# Patient Record
Sex: Female | Born: 1945 | Race: White | Hispanic: No | Marital: Married | Smoking: Never smoker
Health system: Southern US, Community
[De-identification: ages and names within clinical notes are randomized; demographics above are authoritative.]

## PROBLEM LIST (undated history)

## (undated) DIAGNOSIS — M949 Disorder of cartilage, unspecified: Secondary | ICD-10-CM

## (undated) DIAGNOSIS — N938 Other specified abnormal uterine and vaginal bleeding: Secondary | ICD-10-CM

## (undated) DIAGNOSIS — N809 Endometriosis, unspecified: Secondary | ICD-10-CM

## (undated) DIAGNOSIS — K589 Irritable bowel syndrome without diarrhea: Secondary | ICD-10-CM

## (undated) DIAGNOSIS — R252 Cramp and spasm: Secondary | ICD-10-CM

## (undated) DIAGNOSIS — E039 Hypothyroidism, unspecified: Secondary | ICD-10-CM

## (undated) DIAGNOSIS — Z860100 Personal history of colon polyps, unspecified: Secondary | ICD-10-CM

## (undated) DIAGNOSIS — D0510 Intraductal carcinoma in situ of unspecified breast: Secondary | ICD-10-CM

## (undated) DIAGNOSIS — C189 Malignant neoplasm of colon, unspecified: Secondary | ICD-10-CM

## (undated) DIAGNOSIS — N8003 Adenomyosis of the uterus: Secondary | ICD-10-CM

## (undated) DIAGNOSIS — N879 Dysplasia of cervix uteri, unspecified: Secondary | ICD-10-CM

## (undated) DIAGNOSIS — K7689 Other specified diseases of liver: Secondary | ICD-10-CM

## (undated) DIAGNOSIS — M810 Age-related osteoporosis without current pathological fracture: Secondary | ICD-10-CM

## (undated) DIAGNOSIS — N800 Endometriosis of uterus: Secondary | ICD-10-CM

## (undated) DIAGNOSIS — K635 Polyp of colon: Secondary | ICD-10-CM

## (undated) DIAGNOSIS — K449 Diaphragmatic hernia without obstruction or gangrene: Secondary | ICD-10-CM

## (undated) DIAGNOSIS — M899 Disorder of bone, unspecified: Secondary | ICD-10-CM

## (undated) DIAGNOSIS — Z853 Personal history of malignant neoplasm of breast: Secondary | ICD-10-CM

## (undated) DIAGNOSIS — Z8669 Personal history of other diseases of the nervous system and sense organs: Secondary | ICD-10-CM

## (undated) HISTORY — PX: VAGINAL HYSTERECTOMY: SUR661

## (undated) HISTORY — DX: Disorder of cartilage, unspecified: M94.9

## (undated) HISTORY — DX: Adenomyosis of the uterus: N80.03

## (undated) HISTORY — DX: Other specified abnormal uterine and vaginal bleeding: N93.8

## (undated) HISTORY — DX: Hypothyroidism, unspecified: E03.9

## (undated) HISTORY — DX: Disorder of bone, unspecified: M89.9

## (undated) HISTORY — DX: Other specified diseases of liver: K76.89

## (undated) HISTORY — DX: Personal history of colonic polyps: Z86.010

## (undated) HISTORY — DX: Endometriosis, unspecified: N80.9

## (undated) HISTORY — PX: SEPTOPLASTY: SUR1290

## (undated) HISTORY — DX: Malignant neoplasm of colon, unspecified: C18.9

## (undated) HISTORY — PX: BREAST ENHANCEMENT SURGERY: SHX7

## (undated) HISTORY — DX: Irritable bowel syndrome, unspecified: K58.9

## (undated) HISTORY — PX: GYNECOLOGIC CRYOSURGERY: SHX857

## (undated) HISTORY — DX: Endometriosis of uterus: N80.0

## (undated) HISTORY — DX: Personal history of colon polyps, unspecified: Z86.0100

## (undated) HISTORY — PX: SKIN BIOPSY: SHX1

## (undated) HISTORY — DX: Intraductal carcinoma in situ of unspecified breast: D05.10

## (undated) HISTORY — DX: Personal history of other diseases of the nervous system and sense organs: Z86.69

## (undated) HISTORY — PX: BREAST LUMPECTOMY: SHX2

## (undated) HISTORY — PX: AUGMENTATION MAMMAPLASTY: SUR837

## (undated) HISTORY — DX: Polyp of colon: K63.5

## (undated) HISTORY — DX: Diaphragmatic hernia without obstruction or gangrene: K44.9

## (undated) HISTORY — DX: Cramp and spasm: R25.2

## (undated) HISTORY — PX: COLPOSCOPY: SHX161

## (undated) HISTORY — PX: TUBAL LIGATION: SHX77

## (undated) HISTORY — DX: Dysplasia of cervix uteri, unspecified: N87.9

## (undated) HISTORY — PX: BREAST IMPLANT REMOVAL: SUR1101

## (undated) HISTORY — DX: Personal history of malignant neoplasm of breast: Z85.3

## (undated) HISTORY — DX: Age-related osteoporosis without current pathological fracture: M81.0

---
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
## 1978-09-13 HISTORY — PX: PARTIAL HYSTERECTOMY: SHX80

## 1989-05-14 HISTORY — PX: HEMORROIDECTOMY: SUR656

## 1998-09-23 ENCOUNTER — Other Ambulatory Visit: Admission: RE | Admit: 1998-09-23 | Discharge: 1998-09-23 | Payer: Self-pay | Admitting: Obstetrics and Gynecology

## 1999-10-23 ENCOUNTER — Other Ambulatory Visit: Admission: RE | Admit: 1999-10-23 | Discharge: 1999-10-23 | Payer: Self-pay | Admitting: Obstetrics and Gynecology

## 2000-11-09 ENCOUNTER — Other Ambulatory Visit: Admission: RE | Admit: 2000-11-09 | Discharge: 2000-11-09 | Payer: Self-pay | Admitting: Obstetrics and Gynecology

## 2002-03-01 ENCOUNTER — Other Ambulatory Visit: Admission: RE | Admit: 2002-03-01 | Discharge: 2002-03-01 | Payer: Self-pay | Admitting: Obstetrics and Gynecology

## 2003-01-03 ENCOUNTER — Encounter: Payer: Self-pay | Admitting: Internal Medicine

## 2003-03-26 ENCOUNTER — Other Ambulatory Visit: Admission: RE | Admit: 2003-03-26 | Discharge: 2003-03-26 | Payer: Self-pay | Admitting: Obstetrics and Gynecology

## 2003-07-03 ENCOUNTER — Encounter: Payer: Self-pay | Admitting: Family Medicine

## 2003-07-03 ENCOUNTER — Ambulatory Visit (HOSPITAL_COMMUNITY): Admission: RE | Admit: 2003-07-03 | Discharge: 2003-07-03 | Payer: Self-pay | Admitting: Family Medicine

## 2003-08-28 ENCOUNTER — Ambulatory Visit (HOSPITAL_COMMUNITY): Admission: RE | Admit: 2003-08-28 | Discharge: 2003-08-28 | Payer: Self-pay | Admitting: Neurology

## 2003-09-04 ENCOUNTER — Ambulatory Visit (HOSPITAL_COMMUNITY): Admission: RE | Admit: 2003-09-04 | Discharge: 2003-09-04 | Payer: Self-pay | Admitting: Neurology

## 2003-09-04 ENCOUNTER — Encounter: Payer: Self-pay | Admitting: Cardiovascular Disease

## 2004-05-13 ENCOUNTER — Other Ambulatory Visit: Admission: RE | Admit: 2004-05-13 | Discharge: 2004-05-13 | Payer: Self-pay | Admitting: Obstetrics and Gynecology

## 2005-07-14 ENCOUNTER — Encounter: Payer: Self-pay | Admitting: Family Medicine

## 2005-08-04 ENCOUNTER — Other Ambulatory Visit: Admission: RE | Admit: 2005-08-04 | Discharge: 2005-08-04 | Payer: Self-pay | Admitting: Obstetrics and Gynecology

## 2006-04-05 ENCOUNTER — Ambulatory Visit: Payer: Self-pay | Admitting: Internal Medicine

## 2006-04-06 ENCOUNTER — Ambulatory Visit: Payer: Self-pay | Admitting: Internal Medicine

## 2006-04-19 ENCOUNTER — Ambulatory Visit: Payer: Self-pay | Admitting: Internal Medicine

## 2006-10-24 ENCOUNTER — Ambulatory Visit: Payer: Self-pay | Admitting: Family Medicine

## 2006-10-24 ENCOUNTER — Other Ambulatory Visit: Admission: RE | Admit: 2006-10-24 | Discharge: 2006-10-24 | Payer: Self-pay | Admitting: Obstetrics and Gynecology

## 2006-10-24 LAB — CONVERTED CEMR LAB
ALT: 18 units/L (ref 0–40)
Alkaline Phosphatase: 47 units/L (ref 39–117)
BUN: 8 mg/dL (ref 6–23)
Basophils Relative: 1.4 % — ABNORMAL HIGH (ref 0.0–1.0)
Calcium: 9.2 mg/dL (ref 8.4–10.5)
Cholesterol: 202 mg/dL (ref 0–200)
Eosinophils Absolute: 0.1 10*3/uL (ref 0.0–0.6)
GFR calc Af Amer: 94 mL/min
GFR calc non Af Amer: 78 mL/min
HDL: 78.9 mg/dL (ref >39.0)
Lymphocytes Relative: 24.8 % (ref 12.0–46.0)
MCV: 95.9 fL (ref 78.0–100.0)
Monocytes Relative: 8.6 % (ref 3.0–11.0)
Neutro Abs: 2.4 10*3/uL (ref 1.4–7.7)
Platelets: 282 10*3/uL (ref 150–400)
VLDL: 18 mg/dL (ref 0–40)

## 2007-05-09 ENCOUNTER — Encounter (INDEPENDENT_AMBULATORY_CARE_PROVIDER_SITE_OTHER): Payer: Self-pay | Admitting: *Deleted

## 2007-05-09 ENCOUNTER — Ambulatory Visit (HOSPITAL_COMMUNITY): Admission: RE | Admit: 2007-05-09 | Discharge: 2007-05-09 | Payer: Self-pay | Admitting: Endocrinology

## 2007-11-28 ENCOUNTER — Other Ambulatory Visit: Admission: RE | Admit: 2007-11-28 | Discharge: 2007-11-28 | Payer: Self-pay | Admitting: Obstetrics and Gynecology

## 2008-01-17 ENCOUNTER — Encounter: Payer: Self-pay | Admitting: Family Medicine

## 2008-01-17 DIAGNOSIS — K589 Irritable bowel syndrome without diarrhea: Secondary | ICD-10-CM

## 2008-01-17 DIAGNOSIS — Z87898 Personal history of other specified conditions: Secondary | ICD-10-CM

## 2008-01-17 DIAGNOSIS — E039 Hypothyroidism, unspecified: Secondary | ICD-10-CM

## 2008-01-19 ENCOUNTER — Ambulatory Visit: Payer: Self-pay | Admitting: Family Medicine

## 2008-01-19 DIAGNOSIS — R079 Chest pain, unspecified: Secondary | ICD-10-CM

## 2008-01-19 DIAGNOSIS — R5383 Other fatigue: Secondary | ICD-10-CM

## 2008-01-19 DIAGNOSIS — R5381 Other malaise: Secondary | ICD-10-CM | POA: Insufficient documentation

## 2008-01-19 DIAGNOSIS — R252 Cramp and spasm: Secondary | ICD-10-CM | POA: Insufficient documentation

## 2008-01-23 ENCOUNTER — Ambulatory Visit: Payer: Self-pay | Admitting: Internal Medicine

## 2008-02-01 ENCOUNTER — Encounter: Payer: Self-pay | Admitting: Internal Medicine

## 2008-02-01 ENCOUNTER — Ambulatory Visit: Payer: Self-pay | Admitting: Internal Medicine

## 2008-02-02 ENCOUNTER — Telehealth: Payer: Self-pay | Admitting: Internal Medicine

## 2008-02-21 ENCOUNTER — Encounter: Payer: Self-pay | Admitting: Family Medicine

## 2008-03-03 ENCOUNTER — Encounter: Payer: Self-pay | Admitting: Internal Medicine

## 2008-12-03 ENCOUNTER — Other Ambulatory Visit: Admission: RE | Admit: 2008-12-03 | Discharge: 2008-12-03 | Payer: Self-pay | Admitting: Obstetrics and Gynecology

## 2008-12-03 ENCOUNTER — Encounter: Payer: Self-pay | Admitting: Obstetrics and Gynecology

## 2008-12-03 ENCOUNTER — Ambulatory Visit: Payer: Self-pay | Admitting: Obstetrics and Gynecology

## 2008-12-10 ENCOUNTER — Encounter: Payer: Self-pay | Admitting: Family Medicine

## 2008-12-10 LAB — HM MAMMOGRAPHY: HM Mammogram: ABNORMAL

## 2008-12-12 ENCOUNTER — Encounter (INDEPENDENT_AMBULATORY_CARE_PROVIDER_SITE_OTHER): Payer: Self-pay | Admitting: *Deleted

## 2009-02-11 ENCOUNTER — Encounter: Admission: RE | Admit: 2009-02-11 | Discharge: 2009-02-11 | Payer: Self-pay | Admitting: Radiology

## 2009-03-20 ENCOUNTER — Ambulatory Visit (HOSPITAL_COMMUNITY): Admission: RE | Admit: 2009-03-20 | Discharge: 2009-03-20 | Payer: Self-pay | Admitting: General Surgery

## 2009-03-20 ENCOUNTER — Encounter (INDEPENDENT_AMBULATORY_CARE_PROVIDER_SITE_OTHER): Payer: Self-pay | Admitting: General Surgery

## 2009-04-01 ENCOUNTER — Encounter (INDEPENDENT_AMBULATORY_CARE_PROVIDER_SITE_OTHER): Payer: Self-pay | Admitting: General Surgery

## 2009-04-01 ENCOUNTER — Ambulatory Visit (HOSPITAL_BASED_OUTPATIENT_CLINIC_OR_DEPARTMENT_OTHER): Admission: RE | Admit: 2009-04-01 | Discharge: 2009-04-01 | Payer: Self-pay | Admitting: General Surgery

## 2009-04-08 ENCOUNTER — Ambulatory Visit: Payer: Self-pay | Admitting: Oncology

## 2009-04-23 ENCOUNTER — Encounter: Payer: Self-pay | Admitting: Family Medicine

## 2009-04-23 ENCOUNTER — Ambulatory Visit (HOSPITAL_COMMUNITY): Admission: RE | Admit: 2009-04-23 | Discharge: 2009-04-23 | Payer: Self-pay | Admitting: Oncology

## 2009-04-23 LAB — CBC WITH DIFFERENTIAL/PLATELET
Basophils Absolute: 0 10*3/uL (ref 0.0–0.1)
EOS%: 1.1 % (ref 0.0–7.0)
HCT: 39.1 % (ref 34.8–46.6)
HGB: 13.5 g/dL (ref 11.6–15.9)
MCH: 31.9 pg (ref 25.1–34.0)
MCHC: 34.7 g/dL (ref 31.5–36.0)
MCV: 91.9 fL (ref 79.5–101.0)
MONO%: 9.6 % (ref 0.0–14.0)
NEUT%: 62.8 % (ref 38.4–76.8)
RDW: 12.8 % (ref 11.2–14.5)

## 2009-04-24 LAB — VITAMIN D 25 HYDROXY (VIT D DEFICIENCY, FRACTURES): Vit D, 25-Hydroxy: 47 ng/mL (ref 30–89)

## 2009-04-24 LAB — COMPREHENSIVE METABOLIC PANEL
AST: 20 U/L (ref 0–37)
Alkaline Phosphatase: 58 U/L (ref 39–117)
BUN: 13 mg/dL (ref 6–23)
Creatinine, Ser: 0.71 mg/dL (ref 0.40–1.20)

## 2009-04-25 ENCOUNTER — Ambulatory Visit: Admission: RE | Admit: 2009-04-25 | Discharge: 2009-07-14 | Payer: Self-pay | Admitting: Radiation Oncology

## 2009-04-26 ENCOUNTER — Ambulatory Visit (HOSPITAL_COMMUNITY): Admission: RE | Admit: 2009-04-26 | Discharge: 2009-04-26 | Payer: Self-pay | Admitting: Oncology

## 2009-04-26 ENCOUNTER — Encounter (INDEPENDENT_AMBULATORY_CARE_PROVIDER_SITE_OTHER): Payer: Self-pay | Admitting: *Deleted

## 2009-04-28 ENCOUNTER — Encounter: Payer: Self-pay | Admitting: Family Medicine

## 2009-06-27 ENCOUNTER — Ambulatory Visit: Payer: Self-pay | Admitting: Oncology

## 2009-07-01 ENCOUNTER — Encounter: Payer: Self-pay | Admitting: Family Medicine

## 2009-07-01 LAB — CBC WITH DIFFERENTIAL/PLATELET
Basophils Absolute: 0 10*3/uL (ref 0.0–0.1)
EOS%: 2.7 % (ref 0.0–7.0)
HCT: 39.1 % (ref 34.8–46.6)
HGB: 13.8 g/dL (ref 11.6–15.9)
LYMPH%: 15.1 % (ref 14.0–49.7)
MCH: 32.6 pg (ref 25.1–34.0)
NEUT%: 65.3 % (ref 38.4–76.8)
Platelets: 208 10*3/uL (ref 145–400)
lymph#: 0.5 10*3/uL — ABNORMAL LOW (ref 0.9–3.3)

## 2009-07-08 ENCOUNTER — Encounter: Payer: Self-pay | Admitting: Family Medicine

## 2009-08-12 ENCOUNTER — Encounter: Payer: Self-pay | Admitting: Family Medicine

## 2009-08-21 ENCOUNTER — Ambulatory Visit: Payer: Self-pay | Admitting: Oncology

## 2009-08-25 ENCOUNTER — Encounter: Payer: Self-pay | Admitting: Family Medicine

## 2009-08-26 ENCOUNTER — Ambulatory Visit: Payer: Self-pay | Admitting: Obstetrics and Gynecology

## 2009-08-28 ENCOUNTER — Ambulatory Visit: Payer: Self-pay | Admitting: Obstetrics and Gynecology

## 2009-09-19 ENCOUNTER — Ambulatory Visit: Payer: Self-pay | Admitting: Oncology

## 2009-10-03 ENCOUNTER — Encounter: Payer: Self-pay | Admitting: Family Medicine

## 2009-11-21 ENCOUNTER — Ambulatory Visit: Payer: Self-pay | Admitting: Oncology

## 2009-11-25 LAB — CBC WITH DIFFERENTIAL/PLATELET
Basophils Absolute: 0 10*3/uL (ref 0.0–0.1)
Eosinophils Absolute: 0.2 10*3/uL (ref 0.0–0.5)
HGB: 13.6 g/dL (ref 11.6–15.9)
MONO#: 0.4 10*3/uL (ref 0.1–0.9)
NEUT#: 2.8 10*3/uL (ref 1.5–6.5)
RDW: 12.8 % (ref 11.2–14.5)
lymph#: 0.7 10*3/uL — ABNORMAL LOW (ref 0.9–3.3)

## 2009-11-25 LAB — COMPREHENSIVE METABOLIC PANEL
Albumin: 4.4 g/dL (ref 3.5–5.2)
BUN: 9 mg/dL (ref 6–23)
Calcium: 9.5 mg/dL (ref 8.4–10.5)
Chloride: 100 mEq/L (ref 96–112)
Glucose, Bld: 92 mg/dL (ref 70–99)
Potassium: 4.3 mEq/L (ref 3.5–5.3)

## 2009-11-25 LAB — VITAMIN D 25 HYDROXY (VIT D DEFICIENCY, FRACTURES): Vit D, 25-Hydroxy: 52 ng/mL (ref 30–89)

## 2009-12-01 ENCOUNTER — Encounter: Payer: Self-pay | Admitting: Family Medicine

## 2010-01-26 ENCOUNTER — Ambulatory Visit: Payer: Self-pay | Admitting: Oncology

## 2010-01-26 ENCOUNTER — Encounter: Payer: Self-pay | Admitting: Internal Medicine

## 2010-01-26 LAB — CBC WITH DIFFERENTIAL/PLATELET
BASO%: 0.3 % (ref 0.0–2.0)
HCT: 39 % (ref 34.8–46.6)
LYMPH%: 19.9 % (ref 14.0–49.7)
MCHC: 35.2 g/dL (ref 31.5–36.0)
MCV: 93.3 fL (ref 79.5–101.0)
MONO#: 0.3 10*3/uL (ref 0.1–0.9)
NEUT%: 70.3 % (ref 38.4–76.8)
Platelets: 263 10*3/uL (ref 145–400)
WBC: 4.4 10*3/uL (ref 3.9–10.3)

## 2010-01-26 LAB — COMPREHENSIVE METABOLIC PANEL
ALT: 19 U/L (ref 0–35)
CO2: 29 mEq/L (ref 19–32)
Creatinine, Ser: 0.73 mg/dL (ref 0.40–1.20)
Glucose, Bld: 91 mg/dL (ref 70–99)
Total Bilirubin: 0.8 mg/dL (ref 0.3–1.2)

## 2010-01-27 LAB — CANCER ANTIGEN 27.29: CA 27.29: 25 U/mL (ref 0–39)

## 2010-01-27 LAB — VITAMIN D 25 HYDROXY (VIT D DEFICIENCY, FRACTURES): Vit D, 25-Hydroxy: 58 ng/mL (ref 30–89)

## 2010-02-02 ENCOUNTER — Encounter: Payer: Self-pay | Admitting: Family Medicine

## 2010-02-10 ENCOUNTER — Other Ambulatory Visit: Admission: RE | Admit: 2010-02-10 | Discharge: 2010-02-10 | Payer: Self-pay | Admitting: Obstetrics and Gynecology

## 2010-02-10 ENCOUNTER — Ambulatory Visit: Payer: Self-pay | Admitting: Obstetrics and Gynecology

## 2010-02-27 ENCOUNTER — Telehealth: Payer: Self-pay | Admitting: Internal Medicine

## 2010-03-02 DIAGNOSIS — Z853 Personal history of malignant neoplasm of breast: Secondary | ICD-10-CM

## 2010-03-02 DIAGNOSIS — K7689 Other specified diseases of liver: Secondary | ICD-10-CM

## 2010-03-02 DIAGNOSIS — Z860100 Personal history of colon polyps, unspecified: Secondary | ICD-10-CM | POA: Insufficient documentation

## 2010-03-02 DIAGNOSIS — K625 Hemorrhage of anus and rectum: Secondary | ICD-10-CM

## 2010-03-02 DIAGNOSIS — K449 Diaphragmatic hernia without obstruction or gangrene: Secondary | ICD-10-CM | POA: Insufficient documentation

## 2010-03-02 DIAGNOSIS — K5900 Constipation, unspecified: Secondary | ICD-10-CM | POA: Insufficient documentation

## 2010-03-03 ENCOUNTER — Ambulatory Visit: Payer: Self-pay | Admitting: Internal Medicine

## 2010-03-03 ENCOUNTER — Ambulatory Visit: Payer: Self-pay | Admitting: Obstetrics and Gynecology

## 2010-03-04 ENCOUNTER — Telehealth: Payer: Self-pay | Admitting: Internal Medicine

## 2010-03-11 ENCOUNTER — Ambulatory Visit (HOSPITAL_COMMUNITY): Admission: RE | Admit: 2010-03-11 | Discharge: 2010-03-11 | Payer: Self-pay | Admitting: Internal Medicine

## 2010-03-11 ENCOUNTER — Ambulatory Visit: Payer: Self-pay | Admitting: Internal Medicine

## 2010-03-11 ENCOUNTER — Encounter (INDEPENDENT_AMBULATORY_CARE_PROVIDER_SITE_OTHER): Payer: Self-pay | Admitting: *Deleted

## 2010-03-11 LAB — HM SIGMOIDOSCOPY

## 2010-03-12 ENCOUNTER — Encounter: Payer: Self-pay | Admitting: Internal Medicine

## 2010-04-02 ENCOUNTER — Ambulatory Visit: Payer: Self-pay | Admitting: Internal Medicine

## 2010-06-02 ENCOUNTER — Ambulatory Visit: Payer: Self-pay | Admitting: Oncology

## 2010-06-02 LAB — CBC WITH DIFFERENTIAL/PLATELET
BASO%: 0.5 % (ref 0.0–2.0)
EOS%: 2.2 % (ref 0.0–7.0)
LYMPH%: 18.8 % (ref 14.0–49.7)
MCHC: 33.3 g/dL (ref 31.5–36.0)
MCV: 93.3 fL (ref 79.5–101.0)
MONO%: 6.8 % (ref 0.0–14.0)
Platelets: 250 10*3/uL (ref 145–400)
RBC: 4.32 10*6/uL (ref 3.70–5.45)

## 2010-06-02 LAB — COMPREHENSIVE METABOLIC PANEL
ALT: 20 U/L (ref 0–35)
AST: 26 U/L (ref 0–37)
Alkaline Phosphatase: 50 U/L (ref 39–117)
Sodium: 139 mEq/L (ref 135–145)
Total Bilirubin: 1.1 mg/dL (ref 0.3–1.2)
Total Protein: 7.1 g/dL (ref 6.0–8.3)

## 2010-06-08 ENCOUNTER — Encounter: Payer: Self-pay | Admitting: Family Medicine

## 2010-09-03 ENCOUNTER — Ambulatory Visit: Payer: Self-pay | Admitting: Oncology

## 2010-09-08 LAB — COMPREHENSIVE METABOLIC PANEL
ALT: 18 U/L (ref 0–35)
AST: 25 U/L (ref 0–37)
Alkaline Phosphatase: 52 U/L (ref 39–117)
CO2: 29 mEq/L (ref 19–32)
Sodium: 139 mEq/L (ref 135–145)
Total Bilirubin: 0.6 mg/dL (ref 0.3–1.2)
Total Protein: 7.1 g/dL (ref 6.0–8.3)

## 2010-09-08 LAB — CBC WITH DIFFERENTIAL/PLATELET
BASO%: 0.4 % (ref 0.0–2.0)
EOS%: 1.3 % (ref 0.0–7.0)
LYMPH%: 22.3 % (ref 14.0–49.7)
MCH: 32.7 pg (ref 25.1–34.0)
MCHC: 35 g/dL (ref 31.5–36.0)
MONO#: 0.4 10*3/uL (ref 0.1–0.9)
Platelets: 274 10*3/uL (ref 145–400)
RBC: 4.18 10*6/uL (ref 3.70–5.45)
WBC: 4.6 10*3/uL (ref 3.9–10.3)
lymph#: 1 10*3/uL (ref 0.9–3.3)

## 2010-09-10 ENCOUNTER — Encounter: Payer: Self-pay | Admitting: Family Medicine

## 2010-10-11 LAB — CONVERTED CEMR LAB
AST: 24 units/L (ref 0–37)
Albumin: 4.3 g/dL (ref 3.5–5.2)
Basophils Absolute: 0 10*3/uL (ref 0.0–0.1)
Basophils Relative: 0.5 % (ref 0.0–1.0)
Chloride: 104 meq/L (ref 96–112)
Cholesterol: 195 mg/dL (ref 0–200)
Creatinine, Ser: 0.8 mg/dL (ref 0.4–1.2)
Eosinophils Absolute: 0.1 10*3/uL (ref 0.0–0.7)
GFR calc Af Amer: 94 mL/min
GFR calc non Af Amer: 78 mL/min
HDL: 66.4 mg/dL (ref >39.0)
MCHC: 34.1 g/dL (ref 30.0–36.0)
MCV: 95.8 fL (ref 78.0–100.0)
Monocytes Absolute: 0.4 10*3/uL (ref 0.1–1.0)
Neutrophils Relative %: 60.4 % (ref 43.0–77.0)
Pap Smear: NORMAL
Platelets: 265 10*3/uL (ref 150–400)
RBC: 4.43 M/uL (ref 3.87–5.11)
Total Bilirubin: 1 mg/dL (ref 0.3–1.2)
VLDL: 14 mg/dL (ref 0–40)

## 2010-10-13 NOTE — Procedures (Signed)
Summary: Flexible Sigmoidoscopy  Patient: Alyssa Huffman Note: All result statuses are Final unless otherwise noted.  Tests: (1) Flexible Sigmoidoscopy (FLX)  FLX Flexible Sigmoidoscopy                             DONE     [facility]     [address] [place], [place]  [address]           FLEXIBLE SIGMOIDOSCOPY PROCEDURE REPORT           PATIENT:  Alyssa Huffman, Alyssa Huffman  MR#:  [id number]     BIRTHDATE:  [birth year], 64 yrs. old  GENDER:  female           ENDOSCOPIST:  Hedwig Morton. Juanda Chance, MD     Referred by:  Edyth Gunnels, M.D.           PROCEDURE DATE:  03/11/2010     PROCEDURE:  Flexible Sigmoidoscopy with biopsy and polypectomy,     Flexible Sigmoidoscopy with Submucosal Injection     ASA CLASS:  Class I     INDICATIONS:  hematochezia prolapsing anal mucosa with bleeding     colonoscopy 2004 and 2009 showed carpeted hyperplastic polyp in     the rectum as well as a mucosal prolapse syndrome     home test for occult blood is positive,     F hx of colon cancer in an aunt           MEDICATIONS:   Versed 10 mg, Fentanyl 100 mcg           DESCRIPTION OF PROCEDURE:   After the risks benefits and     alternatives of the procedure were thoroughly explained, informed     consent was obtained.  Digital rectal exam was performed and     revealed no rectal masses.   The  endoscope was introduced through     the anus and advanced to the sigmoid colon, without limitations.     The quality of the prep was excellent.  The instrument was then     slowly withdrawn as the mucosa was fully examined.     <<PROCEDUREIMAGES>>           The mucosa was abnormal. carpeted nodular mucosa 0-3 cm consistent     with prior hyperplastic polyp     Multiple biopsies were obtained and sent to pathology. Argon     plasma coagulation ws used to ablate the lesion(s) (see image11,     image10, image9, image7, and image1). ablation using circular     Argon laser probe, see photos, large area ablated  A  pedunculated     polyp was found. prolapsing polyp 1 cm, friable, moves in and out     of the anal canal, aricing at the dentate line, initial impression     was hemorrhoid, but too firm anp nodulat Polyp was snared, then     cauterized with monopolar cautery. Polyp was retrieved and sent to     pathology (see image2, image3, image4, image5, image6, and     image8). injected with 3 cc's of Epinephrine, then snared     Retroflexed views in the rectum revealed anal papillae.    The scope     was then withdrawn from the patient and the procedure terminated.           COMPLICATIONS:  None  ENDOSCOPIC IMPRESSION:     1) Abnormal mucosa     2) Pedunculated polyp     3) Anal papillae     carpeted rectal polyp,/ s/p ERBE ablation abd biopsies     s/p snare removal of a friable bleeding "polyp" which was likely     causing rectal bleeding and heme positive stool     RECOMMENDATIONS:     1) await biopsy results     Anusol HC supp 1 hs     OV 3-4 weeks to reexamine           REPEAT EXAM:  In 1 year(s) for.  flex sigm 1 year with possible     reERBE           ______________________________     Hedwig Morton. Juanda Chance, MD           CC:           n.     eSIGNED:   Hedwig Morton. Lena Fieldhouse at 03/11/2010 11:14 AM           Barbette Hair, [id number]  Note: An exclamation mark (!) indicates a result that was not dispersed into the flowsheet. Document Creation Date: 03/11/2010 11:15 AM _______________________________________________________________________  (1) Order result status: Final Collection or observation date-time: 03/11/2010 10:54 Requested date-time:  Receipt date-time:  Reported date-time:  Referring Physician:   Ordering Physician: Lina Sar [phone]) Specimen Source:  Source: Launa Grill Order Number: [id number] Lab site:   Appended Document: Flexible Sigmoidoscopy OV scheduled for 04-02-10 at 2:30pm. Recall is in IDX for 02/2011.  Appended Document: Flexible  Sigmoidoscopy Flex recall cancelled, Recall is in IDX for a Colonoscopy in 02/2015.

## 2010-10-13 NOTE — Progress Notes (Signed)
Summary: Triage  Phone Note Call from Patient Call back at Home Phone [phone]   Caller: Patient Call For: Dr. Juanda Chance Reason for Call: Talk to Nurse Summary of Call: would like to make Dr. Juanda Chance aware that she has had some blood in her stool... pt's OBGYN advised her to do this Initial call taken by: Vallarie Mare,  March 04, 2010 3:28 PM  Follow-up for Phone Call        Pt. saw her GYNO., had positive stool cards.   Pt. is scheduled for a Flex/Banding/ERBE  on 03-11-10. She will keep her appt. and callback as needed. Follow-up by: Laureen Ochs LPN,  March 04, 2010 3:45 PM  Additional Follow-up for Phone Call Additional follow up Details #1::        She had blood in her stool on my exam as well. That's why we have her on the schedule for flex. sig. Additional Follow-up by: Hart Carwin MD,  March 04, 2010 11:30 PM

## 2010-10-13 NOTE — Procedures (Signed)
Summary: COLON   Colonoscopy  Procedure date:  01/03/2003  Findings:      Location:  [facility].   Patient Name: Alyssa Huffman, Alyssa Huffman MRN:  Procedure Procedures: Colonoscopy CPT: [address].    with polypectomy. CPT: A3573898.  Personnel: Endoscopist: Alvah Lagrow L. Juanda Chance, MD.  Referred By: Stacie Glaze, MD.  Exam Location: Exam performed in Outpatient Clinic. Outpatient  Patient Consent: Procedure, Alternatives, Risks and Benefits discussed, consent obtained, from patient. Consent was obtained by the RN.  Indications  Increased Risk Screening: For family history of colorectal neoplasia, in  maternal aunt  History  Pre-Exam Physical: Performed Jan 03, 2003. Cardio-pulmonary exam, Rectal exam, HEENT exam , Abdominal exam, Extremity exam, Neurological exam, Mental status exam WNL.  Exam Exam: Extent of exam reached: Cecum, extent intended: Cecum.  The cecum was identified by appendiceal orifice and IC valve. Colon retroflexion performed. Images taken. ASA Classification: I. Tolerance: good.  Monitoring: Pulse and BP monitoring, Oximetry used. Supplemental O2 given.  Colon Prep Used Phospho Soda for colon prep. Prep results: good.  Sedation Meds: Patient assessed and found to be appropriate for moderate (conscious) sedation. Fentanyl 100 mcg. given IV. Versed 8 mg. given IV.  Findings NORMAL EXAM: Cecum.  - MULTIPLE POLYPS: Rectum. minimum size 3 mm, maximum size 5 mm. Procedure:  biopsy without cautery, removed, Polyp retrieved, 6 polyps Polyps sent to pathology. ICD9: Colon Polyps: 211.3. Comments: carpetlike nodularity, consistent wit polyps.  POLYP: Rectum, Maximum size: 10 mm. pedunculated polyp. Distance from Anus 1 cm. Procedure:  snare with cautery, removed, retrieved, ICD9: Colon Polyps: 211.3. Comments: papilloma vs polyp.   Assessment Abnormal examination, see findings above.  Diagnoses: 211.3: Colon Polyps.   Comments: carpetlike polypoid lesion in  the rectum, s/p biopsies Events  Unplanned Interventions: No intervention was required.  Unplanned Events: There were no complications. Plans  Post Exam Instructions: No aspirin or non-steroidal containing medications: 2 weeks.  Medication Plan: Await pathology.  Patient Education: Patient given standard instructions for: Polyps. Yearly hemoccult testing recommended.  Comments: depending on the pathology report, will consider ERBE argon laser coagulator to  ablate the rectal lesion Disposition: After procedure patient sent to recovery. After recovery patient sent home.   This report was created from the original endoscopy report, which was reviewed and signed by the above listed endoscopist.

## 2010-10-13 NOTE — Letter (Signed)
Summary: [facility]  [address] [place], [place] [address]   Phone: [phone]  Fax: [phone]       Alyssa Huffman    Jan 22, 1946    MRN: [id number]        Procedure Day /Date: 03/11/10 Wednesday     Arrival Time: 8:00 am     Procedure Time: 9:00 am     Location of Procedure:                     _x _  [facility] ( Outpatient Registration)  PREPARATION FOR FLEXIBLE SIGMOIDOSCOPY WITH MAGNESIUM CITRATE  Prior to the day before your procedure, purchase one 8 oz. bottle of Magnesium Citrate and one Fleet Enema from the laxative section of your drugstore.  _________________________________________________________________________________________________  THE DAY BEFORE YOUR PROCEDURE             DATE: 03/10/10      DAY: Tuesday  1.   Have a clear liquid dinner the night before your procedure.  2.   Do not drink anything colored red or purple.  Avoid juices with pulp.  No orange juice.              CLEAR LIQUIDS INCLUDE: Water Jello Ice Popsicles Tea (sugar ok, no milk/cream) Powdered fruit flavored drinks Coffee (sugar ok, no milk/cream) Gatorade Juice: apple, white grape, white cranberry  Lemonade Clear bullion, consomm, broth Carbonated beverages (any kind) Strained chicken noodle soup Hard Candy   3.   At 7:00 pm the night before your procedure, drink one bottle of Magnesium Citrate over ice.  4.   Drink at least 3 more glasses of clear liquids before bedtime (preferably juices).  5.   Results are expected usually within 1 to 6 hours after taking the Magnesium Citrate.  ___________________________________________________________________________________________________  THE DAY OF YOUR PROCEDURE            DATE: 03/11/10     DAY: Wednesday  1.   Use Fleet Enema one hour prior to coming for procedure.  2.   You may drink clear liquids until 5:00 am (4 hours before exam)       MEDICATION  INSTRUCTIONS  Unless otherwise instructed, you should take regular prescription medications with a small sip of water as early as possible the morning of your procedure.        OTHER INSTRUCTIONS  You will need a responsible adult at least 65 years of age to accompany you and drive you home.   This person must remain in the waiting room during your procedure.  Wear loose fitting clothing that is easily removed.  Leave jewelry and other valuables at home.  However, you may wish to bring a book to read or an iPod/MP3 player to listen to music as you wait for your procedure to start.  Remove all body piercing jewelry and leave at home.  Total time from sign-in until discharge is approximately 2-3 hours.  You should go home directly after your procedure and rest.  You can resume normal activities the day after your procedure.  The day of your procedure you should not:   Drive   Make legal decisions   Operate machinery   Drink alcohol   Return to work  You will receive specific instructions about eating, activities and medications before you leave.   The above instructions have been reviewed and explained to me by  Lamona Curl CMA [facility])  March 03, 2010 11:25 AM     I fully understand and can verbalize these instructions _____________________________ Date 03/03/10

## 2010-10-13 NOTE — Assessment & Plan Note (Signed)
Summary: F/U FROM FLEX. ON 03-11-10              [facility]   History of Present Illness Visit Type: Follow-up Visit Primary GI MD: Lina Sar MD Primary Provider: Roxy Manns, MD Requesting Provider: n/a Chief Complaint: f/u Flex sig and rectal bleeding. Pt states for 1-2 weeks after the procedure she was still having bleeding but now she does not see any and is doing much better. Pt denies any GI sx. History of Present Illness:   This is a 65 year old white female with intermittent rectal bleeding who is status post colonoscopy with ablation of a polypoid lesion of the rectum using the argon laser. The pathology of the lesion showed it to be mucosal prolapse .The biopsy showed edema and mucosal changes without any evidence of a neoplastic process. She had  hypertrophied papillae which were removed. She is doing well after  having some rectal soreness after the procedure. She denies any rectal bleeding. Her main complaint is difficult evacuation of the stool . She has to use manual pressure to paa a BM.   GI Review of Systems      Denies abdominal pain, acid reflux, belching, bloating, chest pain, dysphagia with liquids, dysphagia with solids, heartburn, loss of appetite, nausea, vomiting, vomiting blood, weight loss, and  weight gain.        Denies anal fissure, black tarry stools, change in bowel habit, constipation, diarrhea, diverticulosis, fecal incontinence, heme positive stool, hemorrhoids, irritable bowel syndrome, jaundice, light color stool, liver problems, rectal bleeding, and  rectal pain.    Current Medications (verified): 1)  Synthroid 75 Mcg Tabs (Levothyroxine Sodium) .... Take 1 Tablet By Mouth Once A Day 2)  Phazyme 180 Mg  Caps (Simethicone) .... Take By Mouth As Directed 3)  Fiorinal 50-325-40 Mg  Caps (Butalbital-Aspirin-Caffeine) .... Take 1- 2 Q 4-6 Hrs Prn 4)  Adult Aspirin Low Strength 81 Mg  Tbdp (Aspirin) .... Take One By Mouth Daily 5)  Vitamin E 200 Unit  Caps  (Vitamin E) .... Take One By Mouth Daily 6)  Womens Multivitamin Plus   Tabs (Multiple Vitamins-Minerals) .... Take By Mouth Daily As Directed 7)  Calcium With Vit D 600 Mg .... Take One By Mouth Daily 8)  Vitamin D3 400 Unit Tabs (Cholecalciferol) .... Take 2 Tablets By Mouth Daily 9)  Alendronate Sodium 70 Mg Tabs (Alendronate Sodium) .... Take 1 Tablet By Mouth Once Daily 10)  Vitamin C 500 Mg Tabs (Ascorbic Acid) .... Take 2 Tablets Daily 11)  Biotin 10 Mg Tabs (Biotin) .... Take 3 Tablets in Am and Take 2 Tablets in Pm 12)  Glucosamine-Chondroitin-Vit D3 1500-1200-800 Mg-Mg-Unit Pack (Glucosamine-Chondroitin-Vit D3) .... Take 2 Tablets Daily  Allergies (verified): 1)  ! Codeine  Past History:  Past Medical History: Reviewed history from 03/03/2010 and no changes required. Hypothyroidism migraine IBS colon polyp osteopenia Breast Cancer Hyperlipidemia  GI--Dr Juanda Chance endo---Dr Talmage Nap  Past Surgical History: Reviewed history from 03/02/2010 and no changes required. Right Breast Lumpectomy Bilateral Breast Augmentation Bilateral Breast implants/ removed Hysterectomy- partial, endometriosis Hemorrhoidectomy (1610'R) Tubal Ligation Septoplasty Appendectomy Skin Biopsy   Colonoscopy- polyps, rectal lesions (2004) C-S disk rupture Dexa- ok per pt. ? mild osteopenia per gyn dexa 3/10 osteopenia- fairly stable  3/10 R breast mass on mam/us   Family History: Reviewed history from 03/02/2010 and no changes required. Father: prostate cancer, CAD  Mother: aneurysm age 38, uterine cancer Siblings: brother with colon polyp cousins times 2- died of cancer (?  pancreatic) cousin - cancer Family History of Colon Cancer: Maternal Aunt Family History of Breast Cancer: Aunt  Social History: Reviewed history from 03/03/2010 and no changes required. Marital Status: Married Children: 2 sons Occupation: unemployed Patient has never smoked.  Alcohol Use - no Daily Caffeine Use  2 tall coffee, 1 soda/day Illicit Drug Use - no Patient gets regular exercise.  Review of Systems  The patient denies allergy/sinus, anemia, anxiety-new, arthritis/joint pain, back pain, blood in urine, breast changes/lumps, change in vision, confusion, cough, coughing up blood, depression-new, fainting, fatigue, fever, headaches-new, hearing problems, heart murmur, heart rhythm changes, itching, menstrual pain, muscle pains/cramps, night sweats, nosebleeds, pregnancy symptoms, shortness of breath, skin rash, sleeping problems, sore throat, swelling of feet/legs, swollen lymph glands, thirst - excessive , urination - excessive , urination changes/pain, urine leakage, vision changes, and voice change.         Pertinent positive and negative review of systems were noted in the above HPI. All other ROS was otherwise negative.   Vital Signs:  Patient profile:   65 year old female Height:      63.5 inches Weight:      125.13 pounds BMI:     21.90 Pulse rate:   60 / minute Pulse rhythm:   regular BP sitting:   112 / 70  (right arm) Cuff size:   regular  Vitals Entered By: Christie Nottingham CMA [facility]) (April 02, 2010 2:24 PM)  Physical Exam  General:  Well developed, well nourished, no acute distress. Abdomen:  Soft, nontender and nondistended. No masses, hepatosplenomegaly or hernias noted. Normal bowel sounds. Rectal:  rectal and anoscopic exam reveals normal perianal area, normal anal canal, no prolapsed mucosa. Rectal ampulla showed mild edema and erythema of the folds of the rectal ampulla but no bleeding or ulceration. The changes are consistent with healing mucosa post laser ablation.   Impression & Recommendations:  Problem # 1:  RECTAL BLEEDING (ICD-569.3) Patient is status post  ARGON laser ablation of rectal lesions which turned out to be mucosal prolapse. She is doing well with no recurrence of rectal bleeding. She has  signs of a rectocele, being unable to evacuate the stool. She  will try  high-fiber diet with Metamucil supplements, glycerin suppositories and Anusol suppositories HC once a week for maintenance. I will see her in one year to re-examine the rectum in the office and I recommend a repeat colonoscopy in 5 years.  Patient Instructions: 1)  Anusol-HC suppositories, 1 per week for maintanence. 2)  High-fiber diet. 3)  Fiber supplements daily. 4)  Repeat anoscopy  in one year. 5)  Recall colonoscopy 5 years which would be June 2016. 6)  Copy sent to : Dr Oletha Blend, Dr Milinda Antis 7)  The medication list was reviewed and reconciled.  All changed / newly prescribed medications were explained.  A complete medication list was provided to the patient / caregiver. Prescriptions: ANUSOL-HC 25 MG SUPP (HYDROCORTISONE ACETATE) Insert 1 suppository into rectum once per week for maintenance  #30 x 1   Entered by:   Lamona Curl CMA (AAMA)   Authorized by:   Hart Carwin MD   Signed by:   Lamona Curl CMA (AAMA) on 04/02/2010   Method used:   Electronically to        [facility]  [phone]* (retail)       [address]       [place], [place]  [address]  Ph: [phone] or [phone]       Fax: [phone]   RxID:   [id number] BENTYL 10 MG CAPS (DICYCLOMINE HCL) Take 1 tablet by mouth every 6 hours as needed  #60 x 1   Entered by:   Lamona Curl CMA (AAMA)   Authorized by:   Hart Carwin MD   Signed by:   Lamona Curl CMA (AAMA) on 04/02/2010   Method used:   Electronically to        [facility]  [phone]* (retail)       [address]       [place], [place]  [address]       Ph: [phone] or [phone]       Fax: [phone]   RxID:   [id number]

## 2010-10-13 NOTE — Letter (Signed)
Summary: Patient Notice- Polyp Results  [facility]  [address] [place], [place] [address]   Phone: [phone]  Fax: [phone]        March 12, 2010 MRN: [id number]    [facility] [address] [place], [place]  [address]    Dear Ms. Bora,  I am pleased to inform you that the colon polyp(s) removed during your recent colonoscopy was (were) found to be benign (no cancer detected) upon pathologic examination.The polyp consisted of inflamed prolapsed tissue , no polyp tissue  I recommend you have a repeat colonoscopy examination in 5_ years to look for recurrent polyps, as having colon polyps increases your risk for having recurrent polyps or even colon cancer in the future.  Should you develop new or worsening symptoms of abdominal pain, bowel habit changes or bleeding from the rectum or bowels, please schedule an evaluation with either your primary care physician or with me.  Additional information/recommendations:  __ No further action with gastroenterology is needed at this time. Please      follow-up with your primary care physician for your other healthcare      needs.  _x_ Please call [phone] to schedule a return visit to review your      situation.  __ Please keep your follow-up visit as already scheduled.  _x_ Continue treatment plan as outlined the day of your exam.  Please call us if you are having persistent problems or have questions about your condition that have not been fully answered at this time.  Sincerely,  Hart Carwin MD  This letter has been electronically signed by your physician.  Appended Document: Patient Notice- Polyp Results Letter mailed to patient. Recall is in IDX for a Colonoscopy in 02/2015.

## 2010-10-13 NOTE — Progress Notes (Signed)
Summary: triage  Phone Note From Other Clinic Call back at [phone]   Caller: Efraim Kaufmann, scheduler Call For: Dr. Juanda Chance Reason for Call: Schedule Patient Appt Summary of Call: hx low anal polyps... daily rectal bleeding... Dr. Darnelle Catalan would like pt seen asap... msg left on Melissa's voicemail providing appt information Initial call taken by: Vallarie Mare,  February 27, 2010 10:37 AM  Follow-up for Phone Call        pt sch'ed for next Tuesday with Dr. Juanda Chance at [facility] Follow-up by: Vallarie Mare,  February 27, 2010 1:34 PM

## 2010-10-13 NOTE — Assessment & Plan Note (Signed)
Summary: hx low anal polyps, daily rectal bleeding...as.   History of Present Illness Visit Type: consult Primary GI MD: Lina Sar MD Primary Provider: Roxy Manns, MD Requesting Provider: Ruthann Cancer, MD Chief Complaint: rectal bleeding, pt has history of low anal polyp.  Pt had rectal exam at GYN office that may have aggrivated polyp. 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 A colonoscopy in 2004 showed a hyperplastic polyp and hypertrophied anal papillae. She has a positive family history of colon cancer in a maternal aunt. A repeat colonoscopy in May 2009 showed a hypertrophic rectal polyp and mucosal prolapse. She is now complaining of recurrent prolapse of the mucosa which was previously removed as a  carpeted polyp. Her bowel habits are regular. She denies abdominal pain.   GI Review of Systems      Denies abdominal pain, acid reflux, belching, bloating, chest pain, dysphagia with liquids, dysphagia with solids, heartburn, loss of appetite, nausea, vomiting, vomiting blood, weight loss, and  weight gain.      Reports rectal bleeding and  rectal pain.     Denies anal fissure, black tarry stools, change in bowel habit, constipation, diarrhea, diverticulosis, fecal incontinence, heme positive stool, hemorrhoids, irritable bowel syndrome, jaundice, light color stool, and  liver problems. Preventive Screening-Counseling & Management  Alcohol-Tobacco     Smoking Status: never  Caffeine-Diet-Exercise     Does Patient Exercise: yes      Drug Use:  no.      Current Medications (verified): 1)  Synthroid 75 Mcg Tabs (Levothyroxine Sodium) .... Take 1 Tablet By Mouth Once A Day 2)  Phazyme 180 Mg  Caps (Simethicone) .... Take By Mouth As Directed 3)  Fiorinal 50-325-40 Mg  Caps (Butalbital-Aspirin-Caffeine) .... Take 1- 2 Q 4-6 Hrs Prn 4)  Adult Aspirin Low  Strength 81 Mg  Tbdp (Aspirin) .... Take One By Mouth Daily 5)  Vitamin E 200 Unit  Caps (Vitamin E) .... Take One By Mouth Daily 6)  Womens Multivitamin Plus   Tabs (Multiple Vitamins-Minerals) .... Take By Mouth Daily As Directed 7)  Calcium With Vit D 600 Mg .... Take One By Mouth Daily 8)  Vitamin D3 400 Unit Tabs (Cholecalciferol) .... Take 2 Tablets By Mouth Daily 9)  Alendronate Sodium 70 Mg Tabs (Alendronate Sodium) .... Take 1 Tablet By Mouth Once Daily 10)  Vitamin C 500 Mg Tabs (Ascorbic Acid) .... Take 2 Tablets Daily 11)  Biotin 10 Mg Tabs (Biotin) .... Take 3 Tablets in Am and Take 2 Tablets in Pm 12)  Glucosamine-Chondroitin-Vit D3 1500-1200-800 Mg-Mg-Unit Pack (Glucosamine-Chondroitin-Vit D3) .... Take 2 Tablets Daily  Allergies (verified): 1)  ! Codeine  Past History:  Past Medical History: Hypothyroidism migraine IBS colon polyp osteopenia Breast Cancer Hyperlipidemia  GI--Dr Juanda Chance endo---Dr Talmage Nap  Past Surgical History: Reviewed history from 03/02/2010 and no changes required. Right Breast Lumpectomy Bilateral Breast Augmentation Bilateral Breast implants/ removed Hysterectomy- partial, endometriosis Hemorrhoidectomy (0737'T) Tubal Ligation Septoplasty Appendectomy Skin Biopsy   Colonoscopy- polyps, rectal lesions (2004) C-S disk rupture Dexa- ok per pt. ? mild osteopenia per gyn dexa 3/10 osteopenia- fairly stable  3/10 R breast mass on mam/us   Family History: Reviewed history from 03/02/2010 and no changes required. Father: prostate cancer, CAD  Mother: aneurysm age 31, uterine cancer Siblings: brother with colon polyp cousins times 2- died of cancer (? pancreatic) cousin - cancer Family  History of Colon Cancer: Maternal Aunt Family History of Breast Cancer: Aunt  Social History: Marital Status: Married Children: 2 sons Occupation: unemployed Patient has never smoked.  Alcohol Use - no Daily Caffeine Use 2 tall coffee, 1  soda/day Illicit Drug Use - no Patient gets regular exercise. Drug Use:  no Does Patient Exercise:  yes  Review of Systems  The patient denies allergy/sinus, anemia, anxiety-new, arthritis/joint pain, back pain, blood in urine, breast changes/lumps, confusion, cough, coughing up blood, depression-new, fainting, fatigue, fever, headaches-new, hearing problems, heart murmur, heart rhythm changes, itching, menstrual pain, muscle pains/cramps, night sweats, nosebleeds, pregnancy symptoms, shortness of breath, skin rash, sleeping problems, sore throat, swelling of feet/legs, swollen lymph glands, thirst - excessive, urination - excessive, urination changes/pain, urine leakage, vision changes, and voice change.         Pertinent positive and negative review of systems were noted in the above HPI. All other ROS was otherwise negative.   Vital Signs:  Patient profile:   65 year old female Height:      63.5 inches Weight:      126 pounds BMI:     22.05 BSA:     1.60 Pulse rate:   60 / minute Pulse rhythm:   regular BP sitting:   110 / 72  (left arm) Cuff size:   regular  Vitals Entered By: Francee Piccolo CMA Duncan Dull) (March 03, 2010 10:45 AM)  Physical Exam  General:  Well developed, well nourished, no acute distress. Neck:  Supple; no masses or thyromegaly. Lungs:  Clear throughout to auscultation. Heart:  Regular rate and rhythm; no murmurs, rubs,  or bruits. Abdomen:  Soft, nontender and nondistended. No masses, hepatosplenomegaly or hernias noted. Normal bowel sounds. Rectal:  rectal and anoscopic exam reveals a prolapsing piece of tissue through the anal canal. I cannot tell if it is polyp or just a prolapsed skin tag. It does seem to bleed on the anoscope. There are no hemorrhoids or perianal disease. Stool is Hemoccult negative. There is a large amount of mucus in the rectal ampulla. Extremities:  No clubbing, cyanosis, edema or deformities noted. Skin:  Intact without significant  lesions or rashes. Psych:  Alert and cooperative. Normal mood and affect.   Impression & Recommendations:  Problem # 1:  RECTAL BLEEDING (ICD-569.3)  Patient has small-volume rectal bleeding almost certainly due to the prolapsing  rectal mucosa. It has an appearance of a polyp but the path report indicates mucosal prolapse syndrome. I agree that this should be removed and I will go and schedule her for either banding or cauterization of this tissue.  We may be able to use Argon laser  coagulator depending on the configuration of the lesion. She will be scheduled for a flexible sigmoidoscopy for next week with plans for removal of the prolapsed  tissue.  Orders: ZFLEX with Banding (ZFL with Band)  Patient Instructions: 1)  flexible sigmoidoscopy and removal of prolapsing rectal tissue using sigmoidoscopy prep. 2)  Copy sent to : Dr Darnelle Catalan, Dr Oletha Blend 3)  The medication list was reviewed and reconciled.  All changed / newly prescribed medications were explained.  A complete medication list was provided to the patient / caregiver.

## 2010-10-13 NOTE — Letter (Signed)
[facility]   Imported By: Lanelle Bal 02/16/2010 10:55:14  _____________________________________________________________________  External Attachment:    Type:   Image     Comment:   External Document

## 2010-10-13 NOTE — Procedures (Signed)
Summary: Instructions for procedure/[place]  Instructions for procedure/[place]   Imported By: Sherian Rein 03/04/2010 11:52:24  _____________________________________________________________________  External Attachment:    Type:   Image     Comment:   External Document

## 2010-10-13 NOTE — Letter (Signed)
[facility]   Imported By: Lanelle Bal 12/16/2009 12:04:43  _____________________________________________________________________  External Attachment:    Type:   Image     Comment:   External Document

## 2010-10-13 NOTE — Letter (Signed)
Summary: [facility]  [facility]   Imported By: Lanelle Bal 06/22/2010 11:02:29  _____________________________________________________________________  External Attachment:    Type:   Image     Comment:   External Document

## 2010-10-13 NOTE — Letter (Signed)
Summary: Appt Reminder 2  [facility]  [address] [place], [place] [address]   Phone: [phone]  Fax: [phone]        March 11, 2010 MRN: [id number]    [facility] [address] [place], [place]  [address]    Dear Ms. Leidy,   You have a return appointment with Dr.Dora Juanda Chance on 04-02-10 at 2:30pm. Please remember to bring a complete list of the medicines you are taking, your insurance card and your co-pay.  If you have to cancel or reschedule this appointment, please call before 5:00 pm the evening before to avoid a cancellation fee.  If you have any questions or concerns, please call [phone].    Sincerely,    Laureen Ochs LPN  Appended Document: Appt Reminder 2 Message left for pt. on her phone and letter mailed.

## 2010-10-13 NOTE — Letter (Signed)
[facility]   Imported By: Lanelle Bal 10/14/2009 10:54:19  _____________________________________________________________________  External Attachment:    Type:   Image     Comment:   External Document

## 2010-10-15 NOTE — Letter (Signed)
Summary: [facility]  [facility]   Imported By: Maryln Gottron 09/22/2010 15:20:42  _____________________________________________________________________  External Attachment:    Type:   Image     Comment:   External Document

## 2010-12-08 ENCOUNTER — Other Ambulatory Visit: Payer: Self-pay | Admitting: Oncology

## 2010-12-08 ENCOUNTER — Encounter (HOSPITAL_BASED_OUTPATIENT_CLINIC_OR_DEPARTMENT_OTHER): Payer: Self-pay | Admitting: Oncology

## 2010-12-08 DIAGNOSIS — C50419 Malignant neoplasm of upper-outer quadrant of unspecified female breast: Secondary | ICD-10-CM

## 2010-12-08 LAB — COMPREHENSIVE METABOLIC PANEL
ALT: 17 U/L (ref 0–35)
Alkaline Phosphatase: 42 U/L (ref 39–117)
Creatinine, Ser: 0.83 mg/dL (ref 0.40–1.20)
Sodium: 137 mEq/L (ref 135–145)
Total Bilirubin: 0.8 mg/dL (ref 0.3–1.2)
Total Protein: 6.9 g/dL (ref 6.0–8.3)

## 2010-12-08 LAB — CBC WITH DIFFERENTIAL/PLATELET
BASO%: 0.2 % (ref 0.0–2.0)
EOS%: 1 % (ref 0.0–7.0)
HCT: 36.7 % (ref 34.8–46.6)
LYMPH%: 18.6 % (ref 14.0–49.7)
MCH: 31.4 pg (ref 25.1–34.0)
MCHC: 34.9 g/dL (ref 31.5–36.0)
MCV: 90.2 fL (ref 79.5–101.0)
MONO%: 5.1 % (ref 0.0–14.0)
NEUT%: 75.1 % (ref 38.4–76.8)
Platelets: 267 10*3/uL (ref 145–400)
RBC: 4.07 10*6/uL (ref 3.70–5.45)
WBC: 5.9 10*3/uL (ref 3.9–10.3)

## 2010-12-09 LAB — CANCER ANTIGEN 27.29: CA 27.29: 27 U/mL (ref 0–39)

## 2010-12-16 ENCOUNTER — Encounter (HOSPITAL_BASED_OUTPATIENT_CLINIC_OR_DEPARTMENT_OTHER): Payer: BC Managed Care – PPO | Admitting: Oncology

## 2010-12-16 DIAGNOSIS — C50419 Malignant neoplasm of upper-outer quadrant of unspecified female breast: Secondary | ICD-10-CM

## 2010-12-16 DIAGNOSIS — M129 Arthropathy, unspecified: Secondary | ICD-10-CM

## 2010-12-20 LAB — CBC
HCT: 39.4 % (ref 36.0–46.0)
MCV: 93.7 fL (ref 78.0–100.0)
Platelets: 259 10*3/uL (ref 150–400)
RDW: 12.8 % (ref 11.5–15.5)
WBC: 4.4 10*3/uL (ref 4.0–10.5)

## 2010-12-20 LAB — COMPREHENSIVE METABOLIC PANEL
AST: 26 U/L (ref 0–37)
Albumin: 4.5 g/dL (ref 3.5–5.2)
BUN: 8 mg/dL (ref 6–23)
Calcium: 10.2 mg/dL (ref 8.4–10.5)
Creatinine, Ser: 0.67 mg/dL (ref 0.4–1.2)
GFR calc Af Amer: >60 mL/min (ref >60)
Total Bilirubin: 0.8 mg/dL (ref 0.3–1.2)
Total Protein: 7 g/dL (ref 6.0–8.3)

## 2010-12-20 LAB — DIFFERENTIAL
Basophils Absolute: 0 10*3/uL (ref 0.0–0.1)
Eosinophils Relative: 2 % (ref 0–5)
Lymphocytes Relative: 28 % (ref 12–46)
Lymphs Abs: 1.2 10*3/uL (ref 0.7–4.0)
Monocytes Absolute: 0.4 10*3/uL (ref 0.1–1.0)
Monocytes Relative: 9 % (ref 3–12)
Neutro Abs: 2.7 10*3/uL (ref 1.7–7.7)

## 2011-01-20 ENCOUNTER — Other Ambulatory Visit: Payer: Self-pay | Admitting: Oncology

## 2011-01-20 DIAGNOSIS — Z9889 Other specified postprocedural states: Secondary | ICD-10-CM

## 2011-01-20 DIAGNOSIS — R922 Inconclusive mammogram: Secondary | ICD-10-CM

## 2011-01-26 NOTE — Op Note (Signed)
NAMELELAH, RENNAKER               ACCOUNT NO.:  [id number]   MEDICAL RECORD NO.:  [id number]          PATIENT TYPE:  AMB   LOCATION:  DSC                          FACILITY:  [facility]   PHYSICIAN:  Adolph Pollack, M.D.DATE OF BIRTH:  [birth year]   DATE OF PROCEDURE:  04/01/2009  DATE OF DISCHARGE:                               OPERATIVE REPORT   PREOPERATIVE DIAGNOSIS:  Right breast cancer.   POSTOPERATIVE DIAGNOSIS:  Right breast cancer.   PROCEDURES:  1. Lymphatic mapping, right axilla.  2. Injection of blue dye into right breast.  3. Right axillary sentinel lymph node biopsy (3 lymph nodes).   SURGEON:  Adolph Pollack, MD   ANESTHESIA:  General plus Marcaine local.   INDICATIONS:  This is a 65 year old female who had an abnormality on the  mammogram.  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  Margins were clear.  She now comes back  for a sentinel lymph node biopsy, possible axillary lymph node  dissection.   TECHNIQUE:  She was seen in the holding area and right shoulder marked  with my initials.  Injection of radioactive material was then performed.  She was then brought to the operating room, placed supine on the  operating table and a general anesthetic was administered.  Using the  Neoprobe, I identified an area of high counts in the medial aspect of  the right axilla.  This was marked with a marking pen.  Following this,  I sterilely prepped the periareolar area with alcohol and injected 1.5  mL of blue dye into the 12, 3, 6, and 9 o'clock positions and massaged  the breast for 5 minutes.  Following this, the right axillary area was  sterilely prepped and draped.   A curvilinear incision was made in the right axilla through the skin and  subcutaneous tissue.  Using a Neoprobe, I tracked the high counts down  to the level II lymph node area.  I then was able to identify a lymph  node that was hot but not blue.   I carefully able to pull this node up  into the wound and excised it with counts in the 500-600 range.  Upon  introducing the Neoprobe a second time, I noted another area of  increased counts and identified another second lymph node with increased  counts but not blue.  Using electrocautery and blunt dissection, I  excised this lymph node as well.  There continued to be persistent high  count and I identified a third smaller lymph node that was hot but not  blue and excised this.  All 3 lymph nodes were sent to pathology, and  the touch prep results came back negative for malignancy.   Bleeding was controlled with electrocautery.  I then injected Marcaine  superficially deep into the wound.  The wound was then closed in 2  layers.  The subcutaneous tissue was reapproximated with interrupted 3-0  Vicryl sutures.  The skin was closed with 4-0 Monocryl subcuticular  stitch.  Sterile dressings were applied.   She tolerated  the procedure without any apparent complications and was  taken to recovery room in satisfactory condition.      Adolph Pollack, M.D.  Electronically Signed     TJR/MEDQ  D:  04/01/2009  T:  04/02/2009  Job:  [id number]

## 2011-01-26 NOTE — Op Note (Signed)
Alyssa Huffman, Alyssa Huffman NO.:  [id number]   MEDICAL RECORD NO.:  [id number]          PATIENT TYPE:  OUT   LOCATION:  MLAB                         FACILITY:  [facility]   PHYSICIAN:  Adolph Pollack, M.D.DATE OF BIRTH:  09/22/1945   DATE OF PROCEDURE:  03/20/2009  DATE OF DISCHARGE:  03/20/2009                               OPERATIVE REPORT   PREOPERATIVE DIAGNOSES:  1. Nonpalpable right breast mass/abnormal mammogram.  2. Left shoulder soft tissue mass.   POSTOPERATIVE DIAGNOSES:  1. Nonpalpable right breast mass/abnormal mammogram.  2. Left shoulder soft tissue mass.   PROCEDURES:  1. Right breast biopsy after needle localization.  2. Excision of 2-cm left shoulder mass.   SURGEON:  Adolph Pollack, MD   ANESTHESIA:  General plus local.   INDICATIONS:  Ms. Craigo is a 65 year old female, who had an image-guided  biopsy of a mammographic abnormality, came back with some atypical  cells.  It has been recommended she have right breast biopsy after  needle localization.  MRI was confirmed that the area is atypical as  well.  She also has an enlarging left shoulder soft tissue mass and she  presented for both the above procedures.   TECHNIQUE:  Right breast and the left shoulder area were marked with my  initials.  She was brought to the operating room, placed supine on the  operating table, and given general anesthetic.  The bandage over the  wire was removed and a wire cut to a shorter length.  The right breast  and left shoulder were sterilely prepped and draped, and laterally I  made a curvilinear incision in the right breast and then brought the  wire into the wound.  I then raised skin flaps in all directions and  excised a cylinder-shaped specimen of tissue around the wire all way  down to the chest wall.  This was sent for specimen mammogram, and the  area of concern was contained in the specimen.   Following this, I irrigated the wound and bleeding  points were  controlled with electrocautery.  Once hemostasis was adequate,  approximated the subcutaneous tissue with interrupted 3-0 Vicryl  sutures.  The skin was closed with 4-0 Monocryl subcuticular stitch, and  Steri-Strips were applied.   At this time, gloves and instruments were changed.  I then approached  the left shoulder mass.  I made a small transverse incision directly  over the mesh and carried this through the subcutaneous tissue.  Using  blunt dissection, I identified what appeared to be a lipoma grossly.  I  was able to use blunt dissection to free this up from the surrounding  subcutaneous tissue and then remove it.  It measured approximately 2 cm.  This was sent to Pathology.   Following this, I controlled the bleeding with electrocautery.  I then  closed the subcutaneous tissue with 3-0 Vicryl sutures.  The skin was  closed with 4-0 Monocryl subcuticular stitch.  Steri-Strips were  applied.  Sterile dressings were applied to both wounds.   She tolerated both procedures well without any apparent complications  and was taken to the recovery room in satisfactory condition.      Adolph Pollack, M.D.  Electronically Signed     TJR/MEDQ  D:  03/26/2009  T:  03/26/2009  Job:  [id number]   cc:   Claris Che L. Yolanda Bonine, M.D.

## 2011-02-03 ENCOUNTER — Inpatient Hospital Stay: Admission: RE | Admit: 2011-02-03 | Payer: BC Managed Care – PPO | Source: Ambulatory Visit

## 2011-02-03 ENCOUNTER — Other Ambulatory Visit: Payer: BC Managed Care – PPO

## 2011-02-15 ENCOUNTER — Other Ambulatory Visit: Payer: Self-pay | Admitting: Obstetrics and Gynecology

## 2011-02-15 ENCOUNTER — Encounter (INDEPENDENT_AMBULATORY_CARE_PROVIDER_SITE_OTHER): Payer: BC Managed Care – PPO | Admitting: Obstetrics and Gynecology

## 2011-02-15 ENCOUNTER — Other Ambulatory Visit (HOSPITAL_COMMUNITY)
Admission: RE | Admit: 2011-02-15 | Discharge: 2011-02-15 | Disposition: A | Discharge status: Home / Self Care | Payer: Medicare Other | Source: Ambulatory Visit | Attending: Obstetrics and Gynecology | Admitting: Obstetrics and Gynecology

## 2011-02-15 ENCOUNTER — Other Ambulatory Visit: Payer: BC Managed Care – PPO

## 2011-02-15 DIAGNOSIS — Z124 Encounter for screening for malignant neoplasm of cervix: Secondary | ICD-10-CM | POA: Insufficient documentation

## 2011-02-15 DIAGNOSIS — Z13220 Encounter for screening for lipoid disorders: Secondary | ICD-10-CM

## 2011-02-15 DIAGNOSIS — Z01419 Encounter for gynecological examination (general) (routine) without abnormal findings: Secondary | ICD-10-CM

## 2011-02-16 ENCOUNTER — Ambulatory Visit
Admission: RE | Admit: 2011-02-16 | Discharge: 2011-02-16 | Disposition: A | Discharge status: Home / Self Care | Payer: BC Managed Care – PPO | Source: Ambulatory Visit | Attending: Oncology | Admitting: Oncology

## 2011-02-16 DIAGNOSIS — R922 Inconclusive mammogram: Secondary | ICD-10-CM

## 2011-02-16 DIAGNOSIS — Z9889 Other specified postprocedural states: Secondary | ICD-10-CM

## 2011-02-16 MED ORDER — GADOBENATE DIMEGLUMINE 529 MG/ML IV SOLN: 11.0000 mL | Freq: Once | INTRAVENOUS | Status: AC | PRN

## 2011-06-10 ENCOUNTER — Other Ambulatory Visit: Payer: Self-pay | Admitting: Oncology

## 2011-06-10 ENCOUNTER — Encounter (HOSPITAL_BASED_OUTPATIENT_CLINIC_OR_DEPARTMENT_OTHER): Payer: Medicare Other | Admitting: Oncology

## 2011-06-10 DIAGNOSIS — M899 Disorder of bone, unspecified: Secondary | ICD-10-CM

## 2011-06-10 DIAGNOSIS — C50419 Malignant neoplasm of upper-outer quadrant of unspecified female breast: Secondary | ICD-10-CM

## 2011-06-10 LAB — CBC WITH DIFFERENTIAL/PLATELET
BASO%: 0.6 % (ref 0.0–2.0)
Basophils Absolute: 0 10*3/uL (ref 0.0–0.1)
HCT: 39.2 % (ref 34.8–46.6)
HGB: 13.7 g/dL (ref 11.6–15.9)
MCHC: 34.9 g/dL (ref 31.5–36.0)
MONO#: 0.4 10*3/uL (ref 0.1–0.9)
NEUT#: 2.6 10*3/uL (ref 1.5–6.5)
NEUT%: 62.6 % (ref 38.4–76.8)
WBC: 4.2 10*3/uL (ref 3.9–10.3)
lymph#: 1.1 10*3/uL (ref 0.9–3.3)

## 2011-06-10 LAB — COMPREHENSIVE METABOLIC PANEL
AST: 22 U/L (ref 0–37)
Alkaline Phosphatase: 63 U/L (ref 39–117)
BUN: 15 mg/dL (ref 6–23)
Creatinine, Ser: 0.63 mg/dL (ref 0.50–1.10)

## 2011-06-17 ENCOUNTER — Encounter (HOSPITAL_BASED_OUTPATIENT_CLINIC_OR_DEPARTMENT_OTHER): Payer: Medicare Other | Admitting: Oncology

## 2011-06-17 DIAGNOSIS — C50419 Malignant neoplasm of upper-outer quadrant of unspecified female breast: Secondary | ICD-10-CM

## 2011-07-06 ENCOUNTER — Ambulatory Visit (INDEPENDENT_AMBULATORY_CARE_PROVIDER_SITE_OTHER): Payer: Self-pay | Admitting: General Surgery

## 2011-07-08 ENCOUNTER — Encounter (INDEPENDENT_AMBULATORY_CARE_PROVIDER_SITE_OTHER): Payer: Self-pay | Admitting: General Surgery

## 2011-07-15 ENCOUNTER — Telehealth: Payer: Self-pay | Admitting: Family Medicine

## 2011-07-15 NOTE — Telephone Encounter (Signed)
Pt's last office visit w/ you was for a physical in May,2009.  Patient hasn't been seen since that time because she was diagnosed with cancer and has been going through treatment.  She recently had labwork done and she has high cholesterol.  Patient was calling to set up appointment with you,but it has been over 3 years.  Will you continue seeing her or should I schedule her with another doctor?

## 2011-07-15 NOTE — Telephone Encounter (Signed)
I spoke with patient and she scheduled an appointment on 07/28/11 @ 3:15.

## 2011-07-15 NOTE — Telephone Encounter (Signed)
Put her in with me-- will be starting over -- 30 min appt and I will have to charge her as a new pt

## 2011-07-27 ENCOUNTER — Encounter: Payer: Self-pay | Admitting: Family Medicine

## 2011-07-28 ENCOUNTER — Ambulatory Visit (INDEPENDENT_AMBULATORY_CARE_PROVIDER_SITE_OTHER): Payer: Medicare Other | Admitting: Family Medicine

## 2011-07-28 ENCOUNTER — Encounter: Payer: Self-pay | Admitting: Family Medicine

## 2011-07-28 VITALS — BP 100/64 | HR 68 | Temp 97.5°F | Ht 63.5 in | Wt 126.0 lb

## 2011-07-28 DIAGNOSIS — M899 Disorder of bone, unspecified: Secondary | ICD-10-CM

## 2011-07-28 DIAGNOSIS — E039 Hypothyroidism, unspecified: Secondary | ICD-10-CM

## 2011-07-28 DIAGNOSIS — Z23 Encounter for immunization: Secondary | ICD-10-CM

## 2011-07-28 DIAGNOSIS — Z853 Personal history of malignant neoplasm of breast: Secondary | ICD-10-CM

## 2011-07-28 DIAGNOSIS — B351 Tinea unguium: Secondary | ICD-10-CM | POA: Insufficient documentation

## 2011-07-28 DIAGNOSIS — E785 Hyperlipidemia, unspecified: Secondary | ICD-10-CM | POA: Insufficient documentation

## 2011-07-28 DIAGNOSIS — M949 Disorder of cartilage, unspecified: Secondary | ICD-10-CM

## 2011-07-28 MED ORDER — CICLOPIROX 8 % EX SOLN: Freq: Every day | CUTANEOUS | Status: DC

## 2011-07-28 MED ORDER — TRETINOIN 0.025 % EX CREA: TOPICAL_CREAM | Freq: Every day | CUTANEOUS | Status: AC

## 2011-07-28 NOTE — Patient Instructions (Addendum)
Flu shot today  We may consider pneumonia vaccine in the future if you have not had one (they are recommended after the age of 98)  Avoid red meat/ fried foods/ egg yolks/ fatty breakfast meats/ butter, cheese and high fat dairy/ and shellfish   Stop eating red meat - and we will schedule fasting labs in 1 month  If you are interested in a shingles/zoster vaccine - call your insurance to check on coverage,( you should not get it within 1 month of other vaccines) , then call us for a prescription  for it to take to a pharmacy that gives the shot -- you can ask Dr Darnelle Catalan about that  The femara you are taking can cause loss of bone mass (osteoporosis ) - so , since you are off the fosamax, you may need to discuss that further with Dr Tami Lin (re? Does he want you to take something else)  Please send for last dexa from [facility]  I sent your px to the pharmacy

## 2011-07-28 NOTE — Progress Notes (Signed)
Subjective:    Patient ID: Alyssa Huffman, female    DOB: February 24, 1946, 65 y.o.   MRN: [id number]  HPI Here to re establish for care and disc chronic health problems   Needs new px today  Uses ciclopirox topical solution 8%  .025 mg retin a cream - pays out of pocket -uses it for sun damage   Had a high cholesterol done with Dr Dianne Dun  She was here to review this - but not in the computer  It was not done fasting  ? Remember how high it was  She watches diet - she does not eat fried foods/ sticks with wt watchers / veg and lean protien  2 c coffee and a little soda/ lots of water  Red meat once per week  ? Family hx of chol  Father had a heart attack - and high cholesterol     bp is 100/64 today  bmi is 21 without recent wt change  Hypothyroid Lab Results  Component Value Date   TSH 1.02 01/19/2008   followed by Dr Talmage Nap - last check was 9 mo ago - no dose changes - sees her once per year    Osteopenia dexa just a few months ago  Was on fosamax-- quit that - was bothering her joints , was on that for less than a year  Is on femara for breast cancer- she thinks it makes her knees hurt  Ca and D -- is good about that  Also exercises  D level is 85  Has had breast ca with lumpectomy Sees Dr Darnelle Catalan  Last mam was august and also MRI which were normal    Had labs last mo unremarkable incl cbc with diff / gluc and vit D   Had pap / exam with Dr Dianne Dun in 6/12 Has had a hysterectomy  Td 09 colonosc 09 (father had colon cancer) Flex sig was this June with a rectal polyp  Mother had aneurysm  Zoster status Pneumovax Flu  Lab Results  Component Value Date   CHOL 195 01/19/2008   CHOL 202* 10/24/2006   Lab Results  Component Value Date   HDL 66.4 01/19/2008   HDL 78.9 10/24/2006   Lab Results  Component Value Date   LDLCALC 115* 01/19/2008   Lab Results  Component Value Date   TRIG 69 01/19/2008   TRIG 88 10/24/2006   Lab Results  Component Value  Date   CHOLHDL 2.9 CALC 01/19/2008   CHOLHDL 2.6 CALC 10/24/2006   Lab Results  Component Value Date   LDLDIRECT 96.4 10/24/2006     Does not take flu shot - would get one this year   Patient Active Problem List  Diagnoses  . HYPOTHYROIDISM  . HIATAL HERNIA  . CONSTIPATION  . IBS  . RECTAL BLEEDING  . HEPATIC CYST  . LEG CRAMPS, NOCTURNAL  . OSTEOPENIA  . FATIGUE  . CHEST PAIN  . BREAST CANCER, PERSONAL HX  . COLONIC POLYPS, HYPERPLASTIC, HX OF  . MIGRAINES, HX OF  . Hyperlipidemia  . Fungal nail infection   Past Medical History  Diagnosis Date  . DUB (dysfunctional uterine bleeding)   . Adenomyosis   . 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 Personal history of malignant neoplasm of breast   . Malignant neoplasm of colon, unspecified site     hyperplastic  . Personal history of colonic polyps   . Other specified disorders of liver  Hepatic cyst  . Diaphragmatic hernia without mention of obstruction or gangrene   . Unspecified hypothyroidism   . Irritable bowel syndrome   . Cramp of limb   . Hx of migraines   . Disorder of bone and cartilage, unspecified   . Colon polyps   . Hypothyroid    Past Surgical History  Procedure Date  . Breast lumpectomy     right  . Breast enhancement surgery     bilateral  . Breast implant removal     bilateral  . Partial hysterectomy 1980    endometriosis  . Hemorroidectomy 1990's  . Tubal ligation   . Septoplasty   . Appendectomy 1965  . Skin biopsy    History  Substance Use Topics  . Smoking status: Never Smoker   . Smokeless tobacco: Not on file  . Alcohol Use: No   Family History  Problem Relation Age of Onset  . Uterine cancer Mother   . Aneurysm Mother 51  . Cancer Mother     uterine CA  . Cancer Maternal Aunt     breast  . Colon cancer Maternal Aunt   . Heart disease Father   . Prostate cancer Father   . Cancer Father     prostate CA  . Colon polyps Brother   . Cancer  Cousin     x 2 ? pancreatic   Allergies  Allergen Reactions  . Codeine     REACTION: hallucinations   Current Outpatient Prescriptions on File Prior to Visit  Medication Sig Dispense Refill  . aspirin 81 MG chewable tablet Chew 81 mg by mouth daily.        Marland Kitchen BIOTIN PO Take by mouth.        . Calcium Carbonate-Vit D-Min (CALTRATE PLUS PO) Take by mouth 2 (two) times daily.        . fish oil-omega-3 fatty acids 1000 MG capsule Take 2 g by mouth daily.       Marland Kitchen glucosamine-chondroitin 500-400 MG tablet Take 1 tablet by mouth 2 (two) times daily.       Marland Kitchen levothyroxine (SYNTHROID, LEVOTHROID) 75 MCG tablet Take 75 mcg by mouth daily.        . Multiple Vitamin (MULTIVITAMIN) tablet Take 1 tablet by mouth daily.        Marland Kitchen tretinoin (RETIN-A) 0.025 % cream Apply topically. To apply to face       . VITAMIN E PO Take by mouth.               Review of Systems Review of Systems  Constitutional: Negative for fever, appetite change, fatigue and unexpected weight change.  Eyes: Negative for pain and visual disturbance.  Respiratory: Negative for cough and shortness of breath.   Cardiovascular: Negative for cp or palpitations    Gastrointestinal: Negative for nausea, diarrhea and constipation.  Genitourinary: Negative for urgency and frequency.  Skin: Negative for pallor or rash   Neurological: Negative for weakness, light-headedness, numbness and headaches.  Hematological: Negative for adenopathy. Does not bruise/bleed easily.  Psychiatric/Behavioral: Negative for dysphoric mood. The patient is not nervous/anxious.          Objective:   Physical Exam  Constitutional: She appears well-developed and well-nourished. No distress.  HENT:  Head: Normocephalic and atraumatic.  Right Ear: External ear normal.  Left Ear: External ear normal.  Nose: Nose normal.  Mouth/Throat: Oropharynx is clear and moist.  Eyes: Conjunctivae and EOM are normal. Pupils are equal, round, and  reactive to light.  No scleral icterus.  Neck: Normal range of motion. Neck supple. No JVD present. Carotid bruit is not present. No thyromegaly present.  Cardiovascular: Normal rate, regular rhythm, normal heart sounds and intact distal pulses.  Exam reveals no gallop.   Pulmonary/Chest: Effort normal and breath sounds normal. No respiratory distress. She has no wheezes. She exhibits no tenderness.  Abdominal: Soft. Bowel sounds are normal. She exhibits no distension and no mass. There is no tenderness.  Musculoskeletal: She exhibits no edema and no tenderness.  Lymphadenopathy:    She has no cervical adenopathy.  Neurological: She has normal reflexes. No cranial nerve deficit. She exhibits normal muscle tone. Coordination normal.  Skin: Skin is warm and dry. No rash noted. No erythema. No pallor.       Some thickening of toenails   Psychiatric: She has a normal mood and affect.          Assessment & Plan:

## 2011-07-29 ENCOUNTER — Telehealth: Payer: Self-pay | Admitting: *Deleted

## 2011-07-29 NOTE — Assessment & Plan Note (Signed)
Cared for by endocrinology- pt has no problems and no recent dose changes

## 2011-07-29 NOTE — Assessment & Plan Note (Signed)
Refilled penlac solution which seems to work well for her

## 2011-07-29 NOTE — Assessment & Plan Note (Signed)
Doing well Had lumpectomy and radiation- sees Dr Darnelle Catalan and on femara  Disc imp of watching bone density with this and she will speak to him about that

## 2011-07-29 NOTE — Assessment & Plan Note (Signed)
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

## 2011-07-29 NOTE — Telephone Encounter (Signed)
Prior Alyssa Huffman is needed for tretinoin, form is on your shelf.

## 2011-07-29 NOTE — Assessment & Plan Note (Signed)
Per pt was high at Dr Tonie Griffith office Rev low sat fat diet  Will cut red meat  Re check 1 mo fasting

## 2011-07-30 NOTE — Telephone Encounter (Signed)
Done and in IN box- pt is aware it will be denied by ins and will have to pay out of pocket

## 2011-07-30 NOTE — Telephone Encounter (Signed)
Completed form faxed to [phone] as instructed. Form given to [name]. 

## 2011-08-25 ENCOUNTER — Encounter (INDEPENDENT_AMBULATORY_CARE_PROVIDER_SITE_OTHER): Payer: Self-pay | Admitting: General Surgery

## 2011-08-25 ENCOUNTER — Ambulatory Visit (INDEPENDENT_AMBULATORY_CARE_PROVIDER_SITE_OTHER): Payer: Medicare Other | Admitting: General Surgery

## 2011-08-25 VITALS — BP 116/82 | HR 60 | Temp 97.6°F | Resp 16 | Ht 64.0 in | Wt 124.1 lb

## 2011-08-25 DIAGNOSIS — Z853 Personal history of malignant neoplasm of breast: Secondary | ICD-10-CM

## 2011-08-25 NOTE — Patient Instructions (Signed)
Call if you feel any breast masses.

## 2011-08-25 NOTE — Progress Notes (Signed)
Operation: Right partial mastectomy and sentinel lymph node biopsy  Date:  July 2010  Stage:  T1cN0  Hormone receptor status:  Positive  HPI:  Alyssa Huffman has not been seen by me since 05/2009 and is being followed by Dr. Darnelle Catalan for her hx of right breast cancer.  She is a chronic fluid collection at the lumpectomy site that she is concerned about. She has been having some pain in the inferior aspect of her right breast were her reduction scar is.  She denies any breast masses. She denies any adenopathy.  PE:  Gen.-she looks well and in no acute distress.  Right breast-upper outer quadrant scar is present with some indentation. Inferior breast scar and circumareolar scars are present. No dominant masses are palpable.  Left breast-periareolar and lower breast carcinoma. No dominant masses are palpable.  Lymph nodes-no enlarged cervical, supraclavicular, or axillary lymph nodes  MRI-Finding consistent with a right breast seroma   Assessment:  Right Breast cancer-she has a long-term seroma but no clinical evidence of recurrent cancer. No MRI evidence of recurrent cancer.  Plan:  I do not recommend doing anything about the seroma.  Contnue her followup with Dr. Darnelle Catalan

## 2011-08-27 ENCOUNTER — Other Ambulatory Visit (INDEPENDENT_AMBULATORY_CARE_PROVIDER_SITE_OTHER): Payer: Medicare Other

## 2011-08-27 DIAGNOSIS — E785 Hyperlipidemia, unspecified: Secondary | ICD-10-CM

## 2011-08-27 LAB — LIPID PANEL
Cholesterol: 233 mg/dL — ABNORMAL HIGH (ref 0–200)
HDL: 77 mg/dL (ref >39.00)
Triglycerides: 78 mg/dL (ref 0.0–149.0)

## 2011-09-02 ENCOUNTER — Other Ambulatory Visit: Payer: Self-pay | Admitting: Endocrinology

## 2011-09-02 DIAGNOSIS — E049 Nontoxic goiter, unspecified: Secondary | ICD-10-CM

## 2011-09-10 ENCOUNTER — Ambulatory Visit
Admission: RE | Admit: 2011-09-10 | Discharge: 2011-09-10 | Disposition: A | Discharge status: Home / Self Care | Payer: Medicare Other | Source: Ambulatory Visit | Attending: Endocrinology | Admitting: Endocrinology

## 2011-09-10 DIAGNOSIS — E049 Nontoxic goiter, unspecified: Secondary | ICD-10-CM

## 2011-09-27 ENCOUNTER — Telehealth: Payer: Self-pay | Admitting: Oncology

## 2011-09-27 ENCOUNTER — Other Ambulatory Visit: Payer: Self-pay | Admitting: Oncology

## 2011-09-27 DIAGNOSIS — C50919 Malignant neoplasm of unspecified site of unspecified female breast: Secondary | ICD-10-CM

## 2011-09-27 NOTE — Telephone Encounter (Signed)
S/w the pt and she is aware of her June 2013 appts

## 2011-09-28 ENCOUNTER — Telehealth: Payer: Self-pay | Admitting: Oncology

## 2011-09-28 ENCOUNTER — Other Ambulatory Visit: Payer: Self-pay | Admitting: *Deleted

## 2011-09-28 DIAGNOSIS — C50919 Malignant neoplasm of unspecified site of unspecified female breast: Secondary | ICD-10-CM

## 2011-09-28 DIAGNOSIS — M8580 Other specified disorders of bone density and structure, unspecified site: Secondary | ICD-10-CM

## 2011-09-28 NOTE — Telephone Encounter (Signed)
Pt decided she wants to wait until she speaks with val or dr Darnelle Catalan before setting up this appt. Per pt she will call back to schedule if necessary

## 2011-09-28 NOTE — Progress Notes (Signed)
Per MD review recommended pt is appropriate is either zometa or proleia for osteopenia.  Request pt to see AB/PA with cmet for review of medications prior to administration.  This RN called to managed care and request check for payment on proleia.  Orders entered for above appts.  Message left on pt's return call number per identified VM.

## 2011-09-29 ENCOUNTER — Other Ambulatory Visit: Payer: Self-pay | Admitting: Internal Medicine

## 2011-09-29 MED ORDER — BUTALBITAL-ASA-CAFFEINE 50-325-40 MG PO CAPS: 1.0000 | ORAL_CAPSULE | Freq: Four times a day (QID) | ORAL | Status: AC | PRN

## 2011-09-29 NOTE — Telephone Encounter (Signed)
Px written for call in  Take sparingly and watch out for sedation F/u if no improvement

## 2011-09-29 NOTE — Telephone Encounter (Signed)
Patient notified as instructed by telephone. Medication phoned to [facility] pharmacy as instructed.

## 2011-09-29 NOTE — Telephone Encounter (Signed)
Patient stated you had given her the genetic for Fiorinal for migraine headaches and she states she just ran out and wanted to know if you could refill it for her.  I didn't see it in her chart but she did say it has been awhile since she received this.

## 2011-10-04 ENCOUNTER — Ambulatory Visit: Payer: Medicare Other

## 2011-10-06 ENCOUNTER — Telehealth: Payer: Self-pay | Admitting: Oncology

## 2011-10-06 NOTE — Telephone Encounter (Signed)
lmonvm advising the pt that i will wait for her to call me when she decides if she wants me to schedule her iv infusion appt for zometa. Pt has had some questions that she needed answered before setting up the appts.

## 2011-10-14 ENCOUNTER — Telehealth: Payer: Self-pay | Admitting: *Deleted

## 2011-10-14 NOTE — Telephone Encounter (Signed)
Message left by pt stating she has been reading regarding use of IV/[place] biphosphates and would prefer to take a pill form. Gearldine Bienenstock is inquiring which bio phosphate tablet Dr Darnelle Catalan recommends.

## 2011-10-19 ENCOUNTER — Encounter: Payer: Self-pay | Admitting: *Deleted

## 2011-10-19 NOTE — Progress Notes (Signed)
Per MD, notified pt that she could take Fosamax instead of IV zometa. Pt states that she cant take fosamax (joint pain) pt also states that she is on vacation for 2 weeks and will call this desk when she gets home. Wants to know if "there is something else she may take"

## 2012-01-18 ENCOUNTER — Other Ambulatory Visit: Payer: Self-pay | Admitting: *Deleted

## 2012-01-18 DIAGNOSIS — C50919 Malignant neoplasm of unspecified site of unspecified female breast: Secondary | ICD-10-CM

## 2012-01-18 MED ORDER — CLONAZEPAM 1 MG PO TABS: 1.0000 mg | ORAL_TABLET | Freq: Every evening | ORAL | Status: DC | PRN

## 2012-02-16 ENCOUNTER — Encounter: Payer: Self-pay | Admitting: Obstetrics and Gynecology

## 2012-02-16 ENCOUNTER — Ambulatory Visit (INDEPENDENT_AMBULATORY_CARE_PROVIDER_SITE_OTHER): Payer: Medicare Other | Admitting: Obstetrics and Gynecology

## 2012-02-16 VITALS — BP 120/76 | Ht 64.0 in | Wt 125.0 lb

## 2012-02-16 DIAGNOSIS — M8580 Other specified disorders of bone density and structure, unspecified site: Secondary | ICD-10-CM

## 2012-02-16 DIAGNOSIS — N6019 Diffuse cystic mastopathy of unspecified breast: Secondary | ICD-10-CM

## 2012-02-16 DIAGNOSIS — N644 Mastodynia: Secondary | ICD-10-CM

## 2012-02-16 DIAGNOSIS — N879 Dysplasia of cervix uteri, unspecified: Secondary | ICD-10-CM | POA: Insufficient documentation

## 2012-02-16 DIAGNOSIS — C50919 Malignant neoplasm of unspecified site of unspecified female breast: Secondary | ICD-10-CM

## 2012-02-16 DIAGNOSIS — M899 Disorder of bone, unspecified: Secondary | ICD-10-CM

## 2012-02-16 NOTE — Progress Notes (Signed)
The patient came back to see me today for further followup. She is doing reasonably well menopausally without HRT. She is having no vaginal bleeding. She is having no pelvic pain. 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 She has had normal Pap smears since then. She recently went for a mammogram. She initially had asymmetry on the left breast which with  spot compression resolved. She does have dense breast tissue. As followup with her breast cancer she had a MRI last year which was normal. She generally sees Dr. Yolanda Bonine but someone else read her films this year and she is concerned. She does get occasional mastodynia in the left breast. Dr. Lazarus Gowda has placed her on alendronate because of her femara but she thought it was causing knee pain and she stopped it. On bone density done in May of 2012 she had low bone mass was her worst T score -1.6 and there was not statistically significant change from 2010. She has had no fractures. She continues to have significant problem with her knees and she now thinks her breast cancer medication is aggravating that. She currently does not take alendronate but that has not improved the knee pain.  ROS: 12 system review done. Pertinent positives above. Other positives include hiatal hernia, hypothyroidism,IBS and constipation. She has also had fungal nail infections and hyperlipidemia.  HEENT: Within normal limits. Kennon Portela present. Neck: No masses. Supraclavicular lymph nodes: Not enlarged. Breasts: Examined in both sitting and lying position. Symmetrical without skin changes or masses. Patient has implants. There is a divot in her right breast at 9:00 from previous breast surgery without mass. Abdomen: Soft no masses guarding or rebound. No hernias. Pelvic: External within normal limits. BUS within normal limits. Vaginal examination shows good estrogen effect, no cystocele enterocele or rectocele. Cervix  and uterus absent. Adnexa within normal limits. Rectovaginal confirmatory. Extremities within normal limits.  Assessment: #1. Breast cancer #2. Low bone mass #3. Mastodynia #4. Dense breast tissue was initial concern on mammogram and left breast which resolved with spot compression.  Plan: Suggest that she call and have Dr. Yolanda Bonine  review films. She will also discuss other imaging with her. She will discuss other antiresorptive drugs for her osteopenia with her oncologist. I think for the moment her bones are stable in spite of the Femara and she could be watched. Suggested she see an orthopedist for assessment of her knees as I think that problem is unrelated to her medication. She will go back for short-term mammogeram followup of her left breast.

## 2012-02-17 ENCOUNTER — Other Ambulatory Visit (HOSPITAL_BASED_OUTPATIENT_CLINIC_OR_DEPARTMENT_OTHER): Payer: Medicare Other | Admitting: Lab

## 2012-02-17 ENCOUNTER — Telehealth: Payer: Self-pay | Admitting: Oncology

## 2012-02-17 ENCOUNTER — Ambulatory Visit (HOSPITAL_BASED_OUTPATIENT_CLINIC_OR_DEPARTMENT_OTHER): Payer: Medicare Other | Admitting: Oncology

## 2012-02-17 VITALS — BP 122/71 | HR 63 | Temp 97.9°F | Ht 64.0 in | Wt 125.2 lb

## 2012-02-17 DIAGNOSIS — Z853 Personal history of malignant neoplasm of breast: Secondary | ICD-10-CM

## 2012-02-17 DIAGNOSIS — E569 Vitamin deficiency, unspecified: Secondary | ICD-10-CM

## 2012-02-17 DIAGNOSIS — Z8639 Personal history of other endocrine, nutritional and metabolic disease: Secondary | ICD-10-CM

## 2012-02-17 DIAGNOSIS — C50419 Malignant neoplasm of upper-outer quadrant of unspecified female breast: Secondary | ICD-10-CM

## 2012-02-17 DIAGNOSIS — C50919 Malignant neoplasm of unspecified site of unspecified female breast: Secondary | ICD-10-CM

## 2012-02-17 DIAGNOSIS — Z170 Estrogen receptor positive status [ER+]: Secondary | ICD-10-CM

## 2012-02-17 LAB — CBC WITH DIFFERENTIAL/PLATELET
Basophils Absolute: 0 10*3/uL (ref 0.0–0.1)
Eosinophils Absolute: 0.2 10*3/uL (ref 0.0–0.5)
HCT: 41 % (ref 34.8–46.6)
HGB: 14 g/dL (ref 11.6–15.9)
MONO#: 0.6 10*3/uL (ref 0.1–0.9)
NEUT%: 72 % (ref 38.4–76.8)
Platelets: 296 10*3/uL (ref 145–400)
WBC: 6.1 10*3/uL (ref 3.9–10.3)
lymph#: 0.9 10*3/uL (ref 0.9–3.3)

## 2012-02-17 MED ORDER — LORAZEPAM 0.5 MG PO TABS: 0.5000 mg | ORAL_TABLET | Freq: Every evening | ORAL | Status: AC | PRN

## 2012-02-17 MED ORDER — LETROZOLE 2.5 MG PO TABS: 2.5000 mg | ORAL_TABLET | Freq: Every day | ORAL | Status: DC

## 2012-02-17 NOTE — Progress Notes (Signed)
ID: Alyssa Huffman   DOB: [birth year]  MR#: [id number]  WUJ#:[id number]  HISTORY OF PRESENT ILLNESS:  The patient has been carefully evaluated through the [facility] and in particular December 18, 2008 she underwent ultrasound guided biopsy of the right breast palpable mass.  On December 10, 2008 she underwent mammography and ultrasonography for a right breast mass, which was 1.5 cm in the axillary portion of the right breast with spiculated borders.  It was echogenic measured 1.6 cm by ultrasound and biopsy was recommended and performed on December 18, 2008.  However, that biopsy ([id number]) showed only a benign breast parenchyma.  Dr. Yolanda Bonine remained concerned that the actual mass may have been missed and she suggested excisional biopsy.  A second ultrasound on April 21 was performed just to make sure that the correct mass was biopsied and indeed that seemed to be the case.  At any rate the patient underwent bilateral breast MRIs on February 11, 2009 and this confirmed the presence of a 1.7 cm area of enhancement deep in the right breast with no other suspicious areas.  There were two areas in the liver, which were felt likely to be cysts.  With this information the patient was referred to Dr. Abbey Chatters and after appropriate discussion she underwent right lumpectomy as well as excision of the left shoulder lipoma March 20, 2009.  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  Sentinel lymph node sampling was not done on that procedure since there had not been a prior pathologic diagnosis of breast cancer, but was ultimately performed July 20 with the pathology there ([id number]) showing 3 sentinel lymph nodes negative for tumor.  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 Her subsequent history is as detailed below.  INTERVAL  HISTORY: Alyssa Huffman returns today with her husband Alyssa Huffman for followup of her breast cancer. She had mammography at [facility] 3 weeks ago, showing some possible changes in the left breast, which did not persist on compression views. A six-month followup left breast mammogram was suggested.  REVIEW OF SYSTEMS: She wonders if she should receive a shingles back seen, and aside from cost there is no problem with her receiving that. She has significant problems with her knees and I think she would benefit at this point from orthopedic referral, which is being but in today. Sometimes she has trouble falling asleep, and lorazepam does help with that. She has pain in the left breast which is of concern to her, and she has some urinary leakage which is not a new symptom. Otherwise a detailed review of systems was noncontributory  PAST MEDICAL HISTORY: Past Medical History  Diagnosis Date  . DUB (dysfunctional uterine bleeding)   . Adenomyosis   . 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 Personal history of malignant neoplasm of breast   . Malignant neoplasm of colon, unspecified site     hyperplastic  . Personal history of colonic polyps   . Other specified disorders of liver     Hepatic cyst  . Diaphragmatic hernia without mention of obstruction or gangrene   . Unspecified hypothyroidism   . Irritable bowel syndrome   . Cramp of limb   . Hx of migraines   . Disorder of bone and cartilage, unspecified   . Colon polyps   . Hypothyroid   .  Cervical dysplasia    osteopenia, early cataracts, hypothyroidism, history of a ruptured C5 disc, history of migraines, history of breast augmentation with later explantation (for possible rupture), status post hysterectomy with no salpingo-oophorectomy (the procedure was performed for endometriosis when the patient was 65 years old), history of appendectomy, history of skin biopsy in the right lower extremity for a "nonmalignant" skin area.   In addition, a chest x-ray preop showed possible COPD in this nonsmoker.  PAST SURGICAL HISTORY: Past Surgical History  Procedure Date  . Breast lumpectomy     right  . Breast enhancement surgery     bilateral  . Breast implant removal     bilateral  . Partial hysterectomy 1980    endometriosis  . Hemorroidectomy 1990's  . Tubal ligation   . Septoplasty   . Appendectomy 1965  . Skin biopsy   . Vaginal hysterectomy   . Gynecologic cryosurgery   . Colposcopy   . Augmentation mammaplasty     FAMILY HISTORY Family History  Problem Relation Age of Onset  . Uterine cancer Mother   . Aneurysm Mother 57  . Cancer Mother     uterine CA  . Colon cancer Maternal Aunt   . Breast cancer Maternal Aunt     Age 29's  . Heart disease Father   . Prostate cancer Father   . Cancer Father     prostate CA  . Colon polyps Brother   . Cancer Cousin     x 2 ? pancreatic  . Cancer Maternal Aunt     Brain tumor  The patient's father is alive at age 20.  He is status post MI.  The patient's mother died at age 16.  She had a ruptured cerebral aneurysm and she had a history of uterine cancer, which she survived.  The patient has one sister and two brothers with no cancer.  The patient's mother had five sisters, one of whom had breast cancer diagnosed in her 43s.  There is no history of ovarian cancer in the family.  GYNECOLOGIC HISTORY: The patient is Gx, P2, first pregnancy to term age 28.  She had a hysterectomy age 27 as noted.  She took hormones for approximately 15 years, stopping at the time of this diagnosis.  SOCIAL HISTORY: She is a retired Pharmacist, hospital, used to run a [employer]; husband Alyssa Huffman was Warehouse manager of a company working with IT, now is a Research scientist (medical). Son Alyssa Huffman here in [place] is a Firefighter; son Dealer in [place] also as a Radiation protection practitioner. Patient has a 22 year old grandson. She attends first [employer]  ADVANCED DIRECTIVES: not in  place  HEALTH MAINTENANCE: History  Substance Use Topics  . Smoking status: Never Smoker   . Smokeless tobacco: Never Used  . Alcohol Use: No     Colonoscopy:  PAP:  Bone density:  Lipid panel:  Allergies  Allergen Reactions  . Codeine     REACTION: hallucinations    Current Outpatient Prescriptions  Medication Sig Dispense Refill  . aspirin 81 MG chewable tablet Chew 81 mg by mouth daily.        Marland Kitchen BIOTIN PO Take by mouth.        . Calcium Carbonate-Vit D-Min (CALTRATE PLUS PO) Take by mouth 2 (two) times daily.        . clonazePAM (KLONOPIN) 1 MG tablet Take 1 tablet (1 mg total) by mouth at bedtime as needed for anxiety.  30 tablet  3  .  DiphenhydrAMINE HCl (BENADRYL PO) Take by mouth as needed.        . fish oil-omega-3 fatty acids 1000 MG capsule Take 2 g by mouth daily.       Marland Kitchen glucosamine-chondroitin 500-400 MG tablet Take 1 tablet by mouth 2 (two) times daily.       Marland Kitchen letrozole (FEMARA) 2.5 MG tablet Take 1 tablet by mouth Daily.      Marland Kitchen levothyroxine (SYNTHROID, LEVOTHROID) 75 MCG tablet Take 75 mcg by mouth daily.        . Multiple Vitamin (MULTIVITAMIN) tablet Take 1 tablet by mouth daily.        Marland Kitchen tretinoin (RETIN-A) 0.025 % cream Apply topically. To apply to face       . tretinoin (RETIN-A) 0.025 % cream Apply topically at bedtime.  45 g  5    OBJECTIVE: Middle-aged white woman who appears well Filed Vitals:   02/17/12 1341  BP: 122/71  Pulse: 63  Temp: 97.9 F (36.6 C)     Body mass index is 21.49 kg/(m^2).    ECOG FS: 0  Sclerae unicteric Oropharynx clear No peripheral adenopathy Lungs no rales or rhonchi Heart regular rate and rhythm Abd benign MSK no focal spinal tenderness, no peripheral edema Neuro: nonfocal Breasts: I do not palpate any abnormality in the area of the left breast where she has discomfort. There is no evidence of disease recurrence. The right breast is unremarkable  LAB RESULTS: Lab Results  Component Value Date   WBC 6.1  02/17/2012   NEUTROABS 4.4 02/17/2012   HGB 14.0 02/17/2012   HCT 41.0 02/17/2012   MCV 94.6 02/17/2012   PLT 296 02/17/2012      Chemistry      Component Value Date/Time   NA 135 06/10/2011 1608   K 3.7 06/10/2011 1608   CL 97 06/10/2011 1608   CO2 28 06/10/2011 1608   BUN 15 06/10/2011 1608   CREATININE 0.63 06/10/2011 1608      Component Value Date/Time   CALCIUM 10.1 06/10/2011 1608   ALKPHOS 63 06/10/2011 1608   AST 22 06/10/2011 1608   ALT 16 06/10/2011 1608   BILITOT 0.2* 06/10/2011 1608       Lab Results  Component Value Date   LABCA2 25 06/10/2011   LABCA2 25 06/10/2011    No components found with this basename: [id number]    No results found for this basename: INR:1;PROTIME:1 in the last 168 hours  Urinalysis No results found for this basename: colorurine, appearanceur, labspec, phurine, glucoseu, hgbur, bilirubinur, ketonesur, proteinur, urobilinogen, nitrite, leukocytesur    STUDIES: Mammography at [facility] 01/25/2012 showed heterogeneously dense breasts, with an initial asymmetry in the left breast not persisting on spot compression views. Repeat mammogram in 6 months was recommended to  ASSESSMENT: 66 y.o. [place] woman status post right lumpectomy and sentinel lymph node biopsy in February 15, 2009 for a T1c N0, grade 1 invasive ductal carcinoma.  Tumor was ER/PR positive, HER-2/neu negative with a low MIB-1.  Status post radiation completed in October 2010.  Briefly on tamoxifen and anastrozole, both with poor tolerance.  On letrozole since July 2011 and continuing with very good tolerance.  The plan is to continue letrozole for a total of 5 years.   PLAN: I wonder if she would benefit from a BSGI, since she has heterogeneously dense breasts, and her mammogram was not informative at her the time of her initial diagnosis. A second possibility but a more expensive one would  be to repeat an MRI this year: We have been doing breast MRIs every other year. She will be seeing Dr. Yolanda Bonine  next week and will be able to discuss these options then.  There is no contraindication to her receiving the shingles vaccine, if she and Dr. Milinda Antis agree of on her taking it. As far as her knees is concerned I am referring her to Dr. Norlene Campbell for further evaluation  Alyssa Huffman will return to see [place] again in 6 and 12 months the plan is to continue letrozole for a total of 5 years   Shivon Hackel C    02/17/2012

## 2012-02-17 NOTE — Telephone Encounter (Signed)
gve the pt her dec 2013 appts along with the appt to see dr whitfield at [facility]

## 2012-02-18 LAB — COMPREHENSIVE METABOLIC PANEL
AST: 21 U/L (ref 0–37)
Albumin: 4.7 g/dL (ref 3.5–5.2)
BUN: 7 mg/dL (ref 6–23)
CO2: 26 mEq/L (ref 19–32)
Calcium: 10.1 mg/dL (ref 8.4–10.5)
Chloride: 102 mEq/L (ref 96–112)
Glucose, Bld: 95 mg/dL (ref 70–99)
Potassium: 5 mEq/L (ref 3.5–5.3)

## 2012-02-18 LAB — CANCER ANTIGEN 27.29: CA 27.29: 31 U/mL (ref 0–39)

## 2012-07-11 ENCOUNTER — Other Ambulatory Visit: Payer: Self-pay | Admitting: *Deleted

## 2012-07-11 DIAGNOSIS — Z853 Personal history of malignant neoplasm of breast: Secondary | ICD-10-CM

## 2012-07-11 MED ORDER — LETROZOLE 2.5 MG PO TABS: 2.5000 mg | ORAL_TABLET | Freq: Every day | ORAL | Status: DC

## 2012-08-01 ENCOUNTER — Telehealth: Payer: Self-pay | Admitting: *Deleted

## 2012-08-01 NOTE — Telephone Encounter (Signed)
Pt called to this RN stating concern about continued use of clonopin used for sleep.  Alyssa Huffman is concerned about becoming addicted to it and is wanting Dr Zebedee Iba opinion about switching to Ambien " since it is not a benzodiazepine ".  This RN discussed with pt addiction issues with pt stating she is only using 1 tablet at bedtime no more then 3 nights a week. She is using benadryl on other nights. clonopin works well for sleep, but with the benadryl she tends to wake up and stay awake for several hours mid night.  Per call pt is requesting Dr Zebedee Iba review and opinion on what is best for her to use for sleep that is not a benzodiazepine.  Return call number is [phone].

## 2012-08-07 ENCOUNTER — Other Ambulatory Visit: Payer: Self-pay | Admitting: Oncology

## 2012-08-08 ENCOUNTER — Encounter: Payer: Self-pay | Admitting: Obstetrics and Gynecology

## 2012-08-08 ENCOUNTER — Other Ambulatory Visit: Payer: Self-pay | Admitting: Oncology

## 2012-08-08 DIAGNOSIS — Z853 Personal history of malignant neoplasm of breast: Secondary | ICD-10-CM

## 2012-08-09 ENCOUNTER — Other Ambulatory Visit: Payer: Self-pay | Admitting: Oncology

## 2012-08-09 NOTE — Progress Notes (Signed)
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 Her benadryl generally works well. I suggested she take benadryl nightly and save the clonopin for the nights when the benadryl does not work, and not use the clonopin more frequently than 3x/week, as the less she uses it the better it will work. She understands this is not an addictive drug.  Alyssa Huffman then had many questions regarding her recent mammogram and [place] and whether she will have an MRI next year. Her studies looked fine, and she will have a repeat MM and MRI May-June 2014. All this will be discussed again when see returns to see [place] in 2 weeks

## 2012-08-24 ENCOUNTER — Telehealth: Payer: Self-pay | Admitting: Family Medicine

## 2012-08-24 ENCOUNTER — Ambulatory Visit: Payer: Medicare Other | Admitting: Oncology

## 2012-08-24 ENCOUNTER — Ambulatory Visit (HOSPITAL_BASED_OUTPATIENT_CLINIC_OR_DEPARTMENT_OTHER): Payer: Medicare Other | Admitting: Physician Assistant

## 2012-08-24 ENCOUNTER — Other Ambulatory Visit: Payer: Medicare Other | Admitting: Lab

## 2012-08-24 ENCOUNTER — Encounter: Payer: Self-pay | Admitting: Physician Assistant

## 2012-08-24 ENCOUNTER — Other Ambulatory Visit (HOSPITAL_BASED_OUTPATIENT_CLINIC_OR_DEPARTMENT_OTHER): Payer: Medicare Other | Admitting: Lab

## 2012-08-24 VITALS — BP 97/65 | HR 73 | Temp 97.9°F | Resp 20 | Ht 64.0 in | Wt 121.0 lb

## 2012-08-24 DIAGNOSIS — C50919 Malignant neoplasm of unspecified site of unspecified female breast: Secondary | ICD-10-CM

## 2012-08-24 DIAGNOSIS — Z853 Personal history of malignant neoplasm of breast: Secondary | ICD-10-CM

## 2012-08-24 DIAGNOSIS — C50419 Malignant neoplasm of upper-outer quadrant of unspecified female breast: Secondary | ICD-10-CM

## 2012-08-24 DIAGNOSIS — M8580 Other specified disorders of bone density and structure, unspecified site: Secondary | ICD-10-CM

## 2012-08-24 DIAGNOSIS — E785 Hyperlipidemia, unspecified: Secondary | ICD-10-CM

## 2012-08-24 LAB — COMPREHENSIVE METABOLIC PANEL (CC13)
ALT: 18 U/L (ref 0–55)
Albumin: 4.2 g/dL (ref 3.5–5.0)
Alkaline Phosphatase: 56 U/L (ref 40–150)
CO2: 29 mEq/L (ref 22–29)
Glucose: 123 mg/dl — ABNORMAL HIGH (ref 70–99)
Potassium: 4.7 mEq/L (ref 3.5–5.1)
Sodium: 137 mEq/L (ref 136–145)
Total Protein: 7.2 g/dL (ref 6.4–8.3)

## 2012-08-24 LAB — CBC WITH DIFFERENTIAL/PLATELET
Basophils Absolute: 0 10*3/uL (ref 0.0–0.1)
Eosinophils Absolute: 0.1 10*3/uL (ref 0.0–0.5)
HGB: 13.9 g/dL (ref 11.6–15.9)
NEUT#: 5.2 10*3/uL (ref 1.5–6.5)
RBC: 4.27 10*6/uL (ref 3.70–5.45)
RDW: 13.4 % (ref 11.2–14.5)
WBC: 6.7 10*3/uL (ref 3.9–10.3)
lymph#: 0.9 10*3/uL (ref 0.9–3.3)

## 2012-08-24 MED ORDER — CLONAZEPAM 1 MG PO TABS: 1.0000 mg | ORAL_TABLET | Freq: Every evening | ORAL | Status: DC | PRN

## 2012-08-24 MED ORDER — LETROZOLE 2.5 MG PO TABS: 2.5000 mg | ORAL_TABLET | Freq: Every day | ORAL | Status: DC

## 2012-08-24 NOTE — Progress Notes (Signed)
ID: Alyssa Huffman   DOB: June 12, 1946  MR#: [id number]  WUJ#:[id number]  HISTORY OF PRESENT ILLNESS:  The patient has been carefully evaluated through the [facility] and in particular December 18, 2008 she underwent ultrasound guided biopsy of the right breast palpable mass.  On December 10, 2008 she underwent mammography and ultrasonography for a right breast mass, which was 1.5 cm in the axillary portion of the right breast with spiculated borders.  It was echogenic measured 1.6 cm by ultrasound and biopsy was recommended and performed on December 18, 2008.  However, that biopsy ([id number]) showed only a benign breast parenchyma.  Dr. Yolanda Bonine remained concerned that the actual mass may have been missed and she suggested excisional biopsy.  A second ultrasound on April 21 was performed just to make sure that the correct mass was biopsied and indeed that seemed to be the case.  At any rate the patient underwent bilateral breast MRIs on February 11, 2009 and this confirmed the presence of a 1.7 cm area of enhancement deep in the right breast with no other suspicious areas.  There were two areas in the liver, which were felt likely to be cysts.  With this information the patient was referred to Dr. Abbey Chatters and after appropriate discussion she underwent right lumpectomy as well as excision of the left shoulder lipoma March 20, 2009.  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  Sentinel lymph node sampling was not done on that procedure since there had not been a prior pathologic diagnosis of breast cancer, but was ultimately performed July 20 with the pathology there ([id number]) showing 3 sentinel lymph nodes negative for tumor.  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 Her subsequent history is as detailed below.  INTERVAL  HISTORY: Alyssa Huffman returns today for followup of her breast cancer. She had mammography at [facility] in May, showing some possible changes in the left breast, which did not persist on compression views. A six-month followup left breast mammogram was suggested, and this was obtained along with a left ultrasound on 08/08/2012. The changes were found to be stable. Alyssa Huffman is very concerned because she continues to have pain along the lateral edge of her left breast, and she feels that this has worsened. (Recall that her initial mammogram was noninformative at the time of her initial diagnosis in 2010.)  Otherwise, Alyssa Huffman has had a good summer. She is feeling well. She's tolerating letrozole well with the exception of continued hot flashes. She's had no increased arthralgias. No problems with vaginal dryness. She would like to find a new gynecologist since Dr. Eda Paschal is retiring.  REVIEW OF SYSTEMS: Alyssa Huffman denies any recent illnesses, fevers, or chills. She's had no skin changes and denies abnormal bleeding. No nausea or emesis and no change in bowel habits. She thinks she may be due for colonoscopy and will inquire when she sees Dr. Milinda Antis. Alyssa Huffman denies any increased shortness of breath and has had no chest pain. No abnormal headaches or dizziness. She has mild fatigue and insomnia for which she takes clonazepam appropriately.  A detailed review of systems is otherwise stable and noncontributory.    PAST MEDICAL HISTORY: Past Medical History  Diagnosis Date  . DUB (dysfunctional uterine bleeding)   . Adenomyosis   . 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  Personal history of malignant neoplasm of breast   . Malignant neoplasm of colon, unspecified site     hyperplastic  . Personal history of colonic polyps   . Other specified disorders of liver     Hepatic cyst  . Diaphragmatic hernia without mention of obstruction or gangrene   . Unspecified hypothyroidism   .  Irritable bowel syndrome   . Cramp of limb   . Hx of migraines   . Disorder of bone and cartilage, unspecified   . Colon polyps   . Hypothyroid   . Cervical dysplasia    osteopenia, early cataracts, hypothyroidism, history of a ruptured C5 disc, history of migraines, history of breast augmentation with later explantation (for possible rupture), status post hysterectomy with no salpingo-oophorectomy (the procedure was performed for endometriosis when the patient was 66 years old), history of appendectomy, history of skin biopsy in the right lower extremity for a "nonmalignant" skin area.  In addition, a chest x-ray preop showed possible COPD in this nonsmoker.  PAST SURGICAL HISTORY: Past Surgical History  Procedure Date  . Breast lumpectomy     right  . Breast enhancement surgery     bilateral  . Breast implant removal     bilateral  . Partial hysterectomy 1980    endometriosis  . Hemorroidectomy 1990's  . Tubal ligation   . Septoplasty   . Appendectomy 1965  . Skin biopsy   . Vaginal hysterectomy   . Gynecologic cryosurgery   . Colposcopy   . Augmentation mammaplasty     FAMILY HISTORY Family History  Problem Relation Age of Onset  . Uterine cancer Mother   . Aneurysm Mother 41  . Cancer Mother     uterine CA  . Colon cancer Maternal Aunt   . Breast cancer Maternal Aunt     Age 5's  . Heart disease Father   . Prostate cancer Father   . Cancer Father     prostate CA  . Colon polyps Brother   . Cancer Cousin     x 2 ? pancreatic  . Cancer Maternal Aunt     Brain tumor  The patient's father is alive at age 70.  He is status post MI.  The patient's mother died at age 66.  She had a ruptured cerebral aneurysm and she had a history of uterine cancer, which she survived.  The patient has one sister and two brothers with no cancer.  The patient's mother had five sisters, one of whom had breast cancer diagnosed in her 35s.  There is no history of ovarian cancer in the  family.  GYNECOLOGIC HISTORY: The patient is Gx, P2, first pregnancy to term age 43.  She had a hysterectomy age 45 as noted.  She took hormones for approximately 15 years, stopping at the time of this diagnosis.  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 Son Nida Boatman here in [place] is a Firefighter; son Dealer in [place] also as a Radiation protection practitioner. Patient has a 55 year old grandson. She attends first [employer]  ADVANCED DIRECTIVES: not in place  HEALTH MAINTENANCE: History  Substance Use Topics  . Smoking status: Never Smoker   . Smokeless tobacco: Never Used  . Alcohol Use: No     Colonoscopy:  PAP:  Due in June 2014  Bone density:  May 2012, osteopenia  Lipid panel: "Borderline  high", Dr. Milinda Antis   Allergies  Allergen Reactions  . Codeine     REACTION: hallucinations    Current Outpatient Prescriptions  Medication Sig Dispense Refill  . aspirin 81 MG chewable tablet Chew 81 mg by mouth daily.        Marland Kitchen BIOTIN PO Take by mouth.        . Calcium Carbonate-Vit D-Min (CALTRATE PLUS PO) Take by mouth 2 (two) times daily.        . ciclopirox (PENLAC) 8 % solution       . clonazePAM (KLONOPIN) 1 MG tablet Take 1 tablet (1 mg total) by mouth at bedtime as needed for anxiety.  30 tablet  0  . DiphenhydrAMINE HCl (BENADRYL PO) Take by mouth as needed.        . fish oil-omega-3 fatty acids 1000 MG capsule Take 2 g by mouth daily.       Marland Kitchen glucosamine-chondroitin 500-400 MG tablet Take 1 tablet by mouth 2 (two) times daily.       Marland Kitchen letrozole (FEMARA) 2.5 MG tablet Take 1 tablet (2.5 mg total) by mouth daily.  30 tablet  6  . levothyroxine (SYNTHROID, LEVOTHROID) 75 MCG tablet Take 75 mcg by mouth daily.        . Multiple Vitamin (MULTIVITAMIN) tablet Take 1 tablet by mouth daily.        Marland Kitchen tretinoin (RETIN-A) 0.025 % cream Apply topically.  To apply to face         OBJECTIVE: Middle-aged white woman who appears well Filed Vitals:   08/24/12 1544  BP: 97/65  Pulse: 73  Temp: 97.9 F (36.6 C)  Resp: 20     Body mass index is 20.77 kg/(m^2).    ECOG FS: 0 Filed Weights   08/24/12 1544  Weight: 121 lb (54.885 kg)    Sclerae unicteric Oropharynx clear No peripheral adenopathy Lungs no rales or rhonchi Heart regular rate and rhythm Abdomen soft, nontender, positive bowel sounds MSK no focal spinal tenderness, no peripheral edema Neuro: nonfocal, alert and oriented x3 Breasts: Right breast is status post lumpectomy with no evidence of disease recurrence. Left breast is unremarkable on exam with the exception of tenderness to palpation in the lateral edge. Breast tissue is dense bilaterally. Axillae are benign bilaterally with no adenopathy.  LAB RESULTS: Lab Results  Component Value Date   WBC 6.7 08/24/2012   NEUTROABS 5.2 08/24/2012   HGB 13.9 08/24/2012   HCT 40.3 08/24/2012   MCV 94.4 08/24/2012   PLT 289 08/24/2012      Chemistry      Component Value Date/Time   NA 137 08/24/2012 1500   NA 139 02/17/2012 1324   K 4.7 08/24/2012 1500   K 5.0 02/17/2012 1324   CL 99 08/24/2012 1500   CL 102 02/17/2012 1324   CO2 29 08/24/2012 1500   CO2 26 02/17/2012 1324   BUN 10.0 08/24/2012 1500   BUN 7 02/17/2012 1324   CREATININE 0.9 08/24/2012 1500   CREATININE 0.72 02/17/2012 1324      Component Value Date/Time   CALCIUM 10.5* 08/24/2012 1500   CALCIUM 10.1 02/17/2012 1324   ALKPHOS 56 08/24/2012 1500   ALKPHOS 59 02/17/2012 1324   AST 22 08/24/2012 1500   AST 21 02/17/2012 1324   ALT 18 08/24/2012 1500   ALT 14 02/17/2012 1324   BILITOT 0.51 08/24/2012 1500   BILITOT 0.4 02/17/2012 1324       Lab Results  Component Value  Date   LABCA2 31 02/17/2012    STUDIES: Mammography at [facility] 01/25/2012 showed heterogeneously dense breasts, with an initial asymmetry in the left breast not persisting on spot compression views.    Repeat left mammogram with ultrasound on 08/08/2012 at [facility] showed stable changes, although the asymmetry was again noted. As in May 2013, it did not persist on spot compression views.  Most recent bone density in may 2012 showed osteopenia.    ASSESSMENT: 66 y.o. [place] woman   (1)  status post right lumpectomy and sentinel lymph node biopsy in February 15, 2009 for a T1c N0, grade 1 invasive ductal carcinoma.  Tumor was ER/PR positive, HER-2/neu negative with a low MIB-1.    (2)  Status post radiation completed in October 2010.    (3)  Briefly on tamoxifen and anastrozole, both with poor tolerance.  On letrozole since July 2011 and continuing with very good tolerance.  The plan is to continue letrozole for a total of 5 years.   PLAN:  With regards to the letrozole, Nereyda is doing well and will continue at 2.5 mg daily. This has been refilled for her. She takes Klonopin effectively appropriately at night to help her sleep and I have refilled that as well.  Feleshia is very worried about the asymmetry noted in the left breast, and the fact that she is having increased pain in the lateral edge of the left breast worries her even more. She does have heterogeneously dense breast tissue and her mammogram was not informative at the time of her initial diagnosis, so we feel that a breast MRI would be prudent at this time for further evaluation, status post recent mammogram in November.    Otherwise, she needs to see her primary care, Dr. Milinda Antis, soon for routine physical and to evaluate her hyperlipidemia. July to establish yourself with a new gynecologist since hers is retiring, and we will refer her to Dr. Huel Cote for an appointment in June 2014. She'll be due for bilateral mammograms as well as a repeat bone density in may 2014 at [place], and will return to see [place] for routine followup in June.  All this was reviewed with Britta Mccreedy and she was given this information in writing today. She  voices understanding and agreement, and knows to call with any changes or problems.   Jaran Sainz    08/24/2012

## 2012-08-24 NOTE — Telephone Encounter (Signed)
Put in any 30 min slot you can put together, thanks

## 2012-08-24 NOTE — Telephone Encounter (Signed)
Dr.Magrinat's office called to ask if patient can be seen sooner than April for her cpx.  Patient isn't have any new problems,but she wants her physical before the end of the year.

## 2012-08-25 ENCOUNTER — Other Ambulatory Visit: Payer: Self-pay | Admitting: Physician Assistant

## 2012-08-25 ENCOUNTER — Telehealth: Payer: Self-pay | Admitting: Oncology

## 2012-08-25 DIAGNOSIS — Z853 Personal history of malignant neoplasm of breast: Secondary | ICD-10-CM

## 2012-08-25 LAB — CANCER ANTIGEN 27.29: CA 27.29: 31 U/mL (ref 0–39)

## 2012-08-25 NOTE — Telephone Encounter (Signed)
Pt's appt with dr tower has been moved up to dec per her request. S/w sherri at [facility] and the pt is scheduled for the mammo of the rt breast on 08/31/2012. S/w kathy mcconell and she is aware that the mammo appt has been scheduled and she will call the pt with the breast mri appt the pt is aware of the process.

## 2012-08-25 NOTE — Telephone Encounter (Signed)
S/w linda from dr richardson's office to set the pt up for a June appt. Per their office schedules are not out that far will need to call back in feb for their June appts. Made the pt aware.

## 2012-08-25 NOTE — Telephone Encounter (Signed)
Patient scheduled appointment on 09/11/12.

## 2012-08-30 ENCOUNTER — Other Ambulatory Visit: Payer: Self-pay | Admitting: Family Medicine

## 2012-09-01 ENCOUNTER — Encounter: Payer: Self-pay | Admitting: Obstetrics and Gynecology

## 2012-09-04 ENCOUNTER — Telehealth: Payer: Self-pay | Admitting: Family Medicine

## 2012-09-04 ENCOUNTER — Ambulatory Visit
Admission: RE | Admit: 2012-09-04 | Discharge: 2012-09-04 | Disposition: A | Discharge status: Home / Self Care | Payer: Medicare Other | Source: Ambulatory Visit | Attending: Physician Assistant | Admitting: Physician Assistant

## 2012-09-04 DIAGNOSIS — R252 Cramp and spasm: Secondary | ICD-10-CM

## 2012-09-04 DIAGNOSIS — M899 Disorder of bone, unspecified: Secondary | ICD-10-CM

## 2012-09-04 DIAGNOSIS — E785 Hyperlipidemia, unspecified: Secondary | ICD-10-CM

## 2012-09-04 DIAGNOSIS — Z853 Personal history of malignant neoplasm of breast: Secondary | ICD-10-CM

## 2012-09-04 DIAGNOSIS — E039 Hypothyroidism, unspecified: Secondary | ICD-10-CM

## 2012-09-04 MED ORDER — GADOBENATE DIMEGLUMINE 529 MG/ML IV SOLN
11.0000 mL | Freq: Once | INTRAVENOUS | Status: AC | PRN
  Administered 2012-09-04: 11 mL via INTRAVENOUS

## 2012-09-04 NOTE — Telephone Encounter (Signed)
Message copied by Judy Pimple on Mon Sep 04, 2012  8:42 PM ------      Message from: Baldomero Lamy      Created: Tue Aug 29, 2012 12:51 PM      Regarding: Cpx labs Tues 12/24       .Please order  future cpx labs for pt's upcoming lab appt.      Thanks      Rodney Booze

## 2012-09-05 ENCOUNTER — Other Ambulatory Visit: Payer: Medicare Other

## 2012-09-11 ENCOUNTER — Encounter: Payer: Self-pay | Admitting: Family Medicine

## 2012-09-11 ENCOUNTER — Ambulatory Visit (INDEPENDENT_AMBULATORY_CARE_PROVIDER_SITE_OTHER): Payer: Medicare Other | Admitting: Family Medicine

## 2012-09-11 VITALS — BP 122/64 | HR 66 | Temp 97.7°F | Ht 63.75 in | Wt 121.8 lb

## 2012-09-11 DIAGNOSIS — E785 Hyperlipidemia, unspecified: Secondary | ICD-10-CM

## 2012-09-11 DIAGNOSIS — Z86010 Personal history of colonic polyps: Secondary | ICD-10-CM

## 2012-09-11 DIAGNOSIS — M949 Disorder of cartilage, unspecified: Secondary | ICD-10-CM

## 2012-09-11 DIAGNOSIS — Z853 Personal history of malignant neoplasm of breast: Secondary | ICD-10-CM

## 2012-09-11 DIAGNOSIS — M899 Disorder of bone, unspecified: Secondary | ICD-10-CM

## 2012-09-11 DIAGNOSIS — E039 Hypothyroidism, unspecified: Secondary | ICD-10-CM

## 2012-09-11 LAB — COMPREHENSIVE METABOLIC PANEL
CO2: 29 mEq/L (ref 19–32)
Calcium: 9.6 mg/dL (ref 8.4–10.5)
Creatinine, Ser: 0.7 mg/dL (ref 0.4–1.2)
GFR: 91.85 mL/min (ref >60.00)
Glucose, Bld: 95 mg/dL (ref 70–99)
Total Bilirubin: 0.5 mg/dL (ref 0.3–1.2)

## 2012-09-11 LAB — LIPID PANEL
Cholesterol: 250 mg/dL — ABNORMAL HIGH (ref 0–200)
HDL: 66.4 mg/dL (ref >39.00)
VLDL: 21.8 mg/dL (ref 0.0–40.0)

## 2012-09-11 LAB — LDL CHOLESTEROL, DIRECT: Direct LDL: 157.8 mg/dL

## 2012-09-11 MED ORDER — TRETINOIN 0.025 % EX CREA: TOPICAL_CREAM | Freq: Every day | CUTANEOUS | Status: DC

## 2012-09-11 MED ORDER — CICLOPIROX 8 % EX SOLN: CUTANEOUS | Status: DC

## 2012-09-11 NOTE — Assessment & Plan Note (Signed)
Doing well / on femara and recent reassuring MRI

## 2012-09-11 NOTE — Patient Instructions (Addendum)
If you are interested in a shingles/zoster vaccine - call your insurance to check on coverage,( you should not get it within 1 month of other vaccines) , then call us for a prescription  for it to take to a pharmacy that gives the shot , or make a nurse visit to get it here depending on your coverage If you change your mind about pneumonia vaccine or flu vaccine let me know For a GYN practice I like [facility]- Dr Vincente Poli and Dr Vickey Sages are very good  Labs today   I sent px to your pharmacy

## 2012-09-11 NOTE — Assessment & Plan Note (Signed)
Per pt stable Followed by Dr Talmage Nap For thyroid US soon also

## 2012-09-11 NOTE — Progress Notes (Signed)
Subjective:    Patient ID: Alyssa Huffman, female    DOB: 1945-10-27, 66 y.o.   MRN: [id number]  HPI Here for check up of chronic medical conditions and to review health mt list  Is doing fairly well overall   Has IBS Is bloating more after she eats    Wt is stable with bmi of 21  Pneumovax - not interested int  Flu vaccine-  Zoster status- ? If her ins covers   Pap 6/13 dr Dianne Dun - does not have to have yearly paps any more  Has had a hysterectomy  mammo 12/13- hx of breast cancer, and also had an MRI - was a good report  On femara - doing well  Hx of openia  dexa- will get dexa in May at [facility] ordered that  Intol of fosamax in past  Falls- none , and no broken bones   Mood- stays positive and cheerful, and motivated   Hypothyroidism  Pt has no clinical changes No change in energy level/ hair or skin/ edema and no tremor Lab Results  Component Value Date   TSH 1.02 01/19/2008      Hyperlipidemia Lab Results  Component Value Date   CHOL 233* 08/27/2011   CHOL 195 01/19/2008   CHOL 202* 10/24/2006   Lab Results  Component Value Date   HDL 77.00 08/27/2011   HDL 66.4 01/19/2008   HDL 78.9 10/24/2006   Lab Results  Component Value Date   LDLCALC 115* 01/19/2008   Lab Results  Component Value Date   TRIG 78.0 08/27/2011   TRIG 69 01/19/2008   TRIG 88 10/24/2006   Lab Results  Component Value Date   CHOLHDL 3 08/27/2011   CHOLHDL 2.9 CALC 01/19/2008   CHOLHDL 2.6 CALC 10/24/2006   Lab Results  Component Value Date   LDLDIRECT 129.1 08/27/2011   LDLDIRECT 96.4 10/24/2006    Had colonosc in 09 , and flex sig in 2011  Has had a polyp- rectal  Thinks she is a 10 year follow up    Needs labs today- will have those drawn   Patient Active Problem List  Diagnosis  . HYPOTHYROIDISM  . HIATAL HERNIA  . CONSTIPATION  . IBS  . RECTAL BLEEDING  . HEPATIC CYST  . LEG CRAMPS, NOCTURNAL  . OSTEOPENIA  . BREAST CANCER, PERSONAL HX  . COLONIC POLYPS,  HYPERPLASTIC, HX OF  . MIGRAINES, HX OF  . Hyperlipidemia  . Fungal nail infection  . Cervical dysplasia   Past Medical History  Diagnosis Date  . DUB (dysfunctional uterine bleeding)   . Adenomyosis   . 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 Personal history of malignant neoplasm of breast   . Malignant neoplasm of colon, unspecified site     hyperplastic  . Personal history of colonic polyps   . Other specified disorders of liver     Hepatic cyst  . Diaphragmatic hernia without mention of obstruction or gangrene   . Unspecified hypothyroidism   . Irritable bowel syndrome   . Cramp of limb   . Hx of migraines   . Disorder of bone and cartilage, unspecified   . Colon polyps   . Hypothyroid   . Cervical dysplasia    Past Surgical History  Procedure Date  . Breast lumpectomy     right  . Breast enhancement surgery     bilateral  . Breast implant removal     bilateral  .  Partial hysterectomy 1980    endometriosis  . Hemorroidectomy 1990's  . Tubal ligation   . Septoplasty   . Appendectomy 1965  . Skin biopsy   . Vaginal hysterectomy   . Gynecologic cryosurgery   . Colposcopy   . Augmentation mammaplasty    History  Substance Use Topics  . Smoking status: Never Smoker   . Smokeless tobacco: Never Used  . Alcohol Use: No   Family History  Problem Relation Age of Onset  . Uterine cancer Mother   . Aneurysm Mother 27  . Cancer Mother     uterine CA  . Colon cancer Maternal Aunt   . Breast cancer Maternal Aunt     Age 75's  . Heart disease Father   . Prostate cancer Father   . Cancer Father     prostate CA  . Colon polyps Brother   . Cancer Cousin     x 2 ? pancreatic  . Cancer Maternal Aunt     Brain tumor   Allergies  Allergen Reactions  . Codeine     REACTION: hallucinations   Current Outpatient Prescriptions on File Prior to Visit  Medication Sig Dispense Refill  . aspirin 81 MG chewable tablet Chew 81 mg by  mouth daily.        Marland Kitchen BIOTIN PO Take by mouth.        . Calcium Carbonate-Vit D-Min (CALTRATE PLUS PO) Take 1 capsule by mouth daily.       . ciclopirox (PENLAC) 8 % solution APPLY OVER NAIL & SKIN AROUND DAILY OVER EACH COAT. REMOVE WITH ALCOHOLAFTER 7 DAYS & REPEAT  6.6 mL  0  . clonazePAM (KLONOPIN) 1 MG tablet Take 1 tablet (1 mg total) by mouth at bedtime as needed for anxiety.  30 tablet  0  . DiphenhydrAMINE HCl (BENADRYL PO) Take by mouth as needed.        . fish oil-omega-3 fatty acids 1000 MG capsule Take 2 g by mouth daily.       Marland Kitchen glucosamine-chondroitin 500-400 MG tablet Take 1 tablet by mouth 2 (two) times daily.       Marland Kitchen levothyroxine (SYNTHROID, LEVOTHROID) 75 MCG tablet Take 75 mcg by mouth daily.        . Multiple Vitamin (MULTIVITAMIN) tablet Take 1 tablet by mouth daily.        Marland Kitchen tretinoin (RETIN-A) 0.025 % cream Apply topically. To apply to face           Review of Systems Review of Systems  Constitutional: Negative for fever, appetite change, fatigue and unexpected weight change.  Eyes: Negative for pain and visual disturbance.  Respiratory: Negative for cough and shortness of breath.   Cardiovascular: Negative for cp or palpitations    Gastrointestinal: Negative for nausea, diarrhea and constipation. pos for occ bloating, neg for abd pain  Genitourinary: Negative for urgency and frequency.  Skin: Negative for pallor or rash   MSK pos for bunions  Neurological: Negative for weakness, light-headedness, numbness and headaches.  Hematological: Negative for adenopathy. Does not bruise/bleed easily.  Psychiatric/Behavioral: Negative for dysphoric mood. The patient is not nervous/anxious.         Objective:   Physical Exam  Constitutional: She appears well-developed and well-nourished. No distress.  HENT:  Head: Normocephalic and atraumatic.  Right Ear: External ear normal.  Left Ear: External ear normal.  Nose: Nose normal.  Mouth/Throat: Oropharynx is clear and  moist. No oropharyngeal exudate.  Eyes: Conjunctivae normal and  EOM are normal. Pupils are equal, round, and reactive to light. Right eye exhibits no discharge. Left eye exhibits no discharge. No scleral icterus.  Neck: Normal range of motion. Neck supple. No JVD present. Carotid bruit is not present. No thyromegaly present.  Cardiovascular: Normal rate, regular rhythm, normal heart sounds and intact distal pulses.  Exam reveals no gallop.   Pulmonary/Chest: Effort normal and breath sounds normal. No respiratory distress. She has no wheezes.  Abdominal: Soft. Bowel sounds are normal. She exhibits no distension, no abdominal bruit and no mass. There is no tenderness.  Musculoskeletal: Normal range of motion. She exhibits no edema and no tenderness.  Lymphadenopathy:    She has no cervical adenopathy.  Neurological: She is alert. She has normal reflexes. No cranial nerve deficit. She exhibits normal muscle tone. Coordination normal.  Skin: Skin is warm and dry. No rash noted. No erythema. No pallor.       Solar lentigos diffusely   Psychiatric: She has a normal mood and affect.          Assessment & Plan:

## 2012-09-11 NOTE — Assessment & Plan Note (Signed)
Rev last colonosc  No bowel changes

## 2012-09-11 NOTE — Assessment & Plan Note (Signed)
Lab today Rev low sat fat diet and imp of that

## 2012-09-11 NOTE — Assessment & Plan Note (Signed)
Has appt for next dexa in May On femara Intol of fosamax  No falls or fx Disc ca and D and exercise and safety

## 2012-09-12 LAB — CBC WITH DIFFERENTIAL/PLATELET
Basophils Relative: 0.1 % (ref 0.0–3.0)
Eosinophils Relative: 2 % (ref 0.0–5.0)
HCT: 39.5 % (ref 36.0–46.0)
Hemoglobin: 13.4 g/dL (ref 12.0–15.0)
Lymphs Abs: 0.8 10*3/uL (ref 0.7–4.0)
Monocytes Relative: 8.3 % (ref 3.0–12.0)
Neutro Abs: 3.6 10*3/uL (ref 1.4–7.7)
RBC: 4.17 Mil/uL (ref 3.87–5.11)
WBC: 4.8 10*3/uL (ref 4.5–10.5)

## 2012-09-14 ENCOUNTER — Encounter: Payer: Self-pay | Admitting: *Deleted

## 2012-09-20 ENCOUNTER — Telehealth: Payer: Self-pay

## 2012-09-20 NOTE — Telephone Encounter (Signed)
Prior auth request form for Retin A completed and faxed to [employer]. Denial letter received from [employer].Dr Milinda Antis made note on denial letter that pt was expecting to be denied. Sent for scanning.

## 2012-09-22 ENCOUNTER — Telehealth: Payer: Self-pay

## 2012-09-22 NOTE — Telephone Encounter (Signed)
Left voicemail letting pt know prior-auth for Retin-A was denied

## 2012-09-22 NOTE — Telephone Encounter (Signed)
Pt left v/m received call from Dr Royden Purl office and returning call.

## 2012-09-28 ENCOUNTER — Other Ambulatory Visit: Payer: Self-pay | Admitting: Endocrinology

## 2012-09-28 DIAGNOSIS — E041 Nontoxic single thyroid nodule: Secondary | ICD-10-CM

## 2012-10-16 ENCOUNTER — Telehealth: Payer: Self-pay | Admitting: Family Medicine

## 2012-10-16 NOTE — Telephone Encounter (Signed)
Left voicemail letting pt know Dr. Tower's comments  

## 2012-10-16 NOTE — Telephone Encounter (Signed)
[facility] see Dr Marcelle Overlie Dr Esperanza Richters is also great as is Dr Rana Snare

## 2012-10-16 NOTE — Telephone Encounter (Signed)
Dr. Milinda Antis, The patient said you meant to tell her who your GYN is and the name of someone else in that office incase she cannot get in with your GYN.  She would like the names of those doctors. [phone]

## 2012-11-20 ENCOUNTER — Other Ambulatory Visit: Payer: Medicare Other

## 2012-11-26 ENCOUNTER — Other Ambulatory Visit: Payer: Self-pay | Admitting: Oncology

## 2012-11-26 DIAGNOSIS — C50911 Malignant neoplasm of unspecified site of right female breast: Secondary | ICD-10-CM

## 2012-12-13 ENCOUNTER — Encounter: Payer: Medicare Other | Admitting: Family Medicine

## 2012-12-13 ENCOUNTER — Ambulatory Visit
Admission: RE | Admit: 2012-12-13 | Discharge: 2012-12-13 | Disposition: A | Discharge status: Home / Self Care | Payer: Medicare Other | Source: Ambulatory Visit | Attending: Endocrinology | Admitting: Endocrinology

## 2012-12-13 ENCOUNTER — Other Ambulatory Visit (INDEPENDENT_AMBULATORY_CARE_PROVIDER_SITE_OTHER): Payer: Medicare Other

## 2012-12-13 DIAGNOSIS — E785 Hyperlipidemia, unspecified: Secondary | ICD-10-CM

## 2012-12-13 DIAGNOSIS — M949 Disorder of cartilage, unspecified: Secondary | ICD-10-CM

## 2012-12-13 DIAGNOSIS — E041 Nontoxic single thyroid nodule: Secondary | ICD-10-CM

## 2012-12-13 LAB — LIPID PANEL: VLDL: 16.8 mg/dL (ref 0.0–40.0)

## 2012-12-13 LAB — LDL CHOLESTEROL, DIRECT: Direct LDL: 146.5 mg/dL

## 2012-12-15 ENCOUNTER — Telehealth: Payer: Self-pay

## 2012-12-15 MED ORDER — ZOSTER VACCINE LIVE 19400 UNT/0.65ML ~~LOC~~ SOLR: 0.6500 mL | Freq: Once | SUBCUTANEOUS | Status: DC

## 2012-12-15 NOTE — Telephone Encounter (Signed)
Pt left v/m requesting shingles vaccine order sent to [facility] [address]. Pt request call when order sent in.Please advise.

## 2012-12-15 NOTE — Telephone Encounter (Signed)
I sent it electronically  

## 2012-12-15 NOTE — Telephone Encounter (Signed)
Left voicemail letting pt know shingles vaccine sent to pharmacy

## 2012-12-19 ENCOUNTER — Encounter: Payer: Self-pay | Admitting: Family Medicine

## 2012-12-19 ENCOUNTER — Ambulatory Visit (INDEPENDENT_AMBULATORY_CARE_PROVIDER_SITE_OTHER): Payer: Medicare Other | Admitting: Family Medicine

## 2012-12-19 VITALS — BP 116/74 | HR 69 | Temp 97.8°F | Ht 63.75 in | Wt 124.5 lb

## 2012-12-19 DIAGNOSIS — E785 Hyperlipidemia, unspecified: Secondary | ICD-10-CM

## 2012-12-19 NOTE — Assessment & Plan Note (Signed)
This is improved with chol/ HDL ratio of 3 (dec LDL and inc HDL helped )  Still would like LDL under 130- will continue to work on that Disc goals for lipids and reasons to control them Rev labs with pt Rev low sat fat diet in detail  Will continue to follow

## 2012-12-19 NOTE — Patient Instructions (Addendum)
Your cholesterol is improved-keep working on it with healthy diet and exercise Avoid red meat/ fried foods/ egg yolks/ fatty breakfast meats/ butter, cheese and high fat dairy/ and shellfish   Will continue to watch it closely Do not mix aleve and ibuprofen- they are in the same family  If you take aleve- take it always on a full stomach and drink lots of fluids

## 2012-12-19 NOTE — Progress Notes (Signed)
Subjective:    Patient ID: Alyssa Huffman, female    DOB: 1946/08/05, 67 y.o.   MRN: [id number]  HPI Here for f/u of hyperlipidemia and D level  Is doing well overall   Lab Results  Component Value Date   CHOL 239* 12/13/2012   CHOL 250* 09/11/2012   CHOL 233* 08/27/2011   Lab Results  Component Value Date   HDL 73.80 12/13/2012   HDL 66.40 09/11/2012   HDL 77.00 08/27/2011   Lab Results  Component Value Date   LDLCALC 115* 01/19/2008   Lab Results  Component Value Date   TRIG 84.0 12/13/2012   TRIG 109.0 09/11/2012   TRIG 78.0 08/27/2011   Lab Results  Component Value Date   CHOLHDL 3 12/13/2012   CHOLHDL 4 09/11/2012   CHOLHDL 3 08/27/2011   Lab Results  Component Value Date   LDLDIRECT 146.5 12/13/2012   LDLDIRECT 157.8 09/11/2012   LDLDIRECT 129.1 08/27/2011   last visit disc lifestyle change- stopped beef  Knows what foods to watch - stays away from fried food/ fast food/ bkfast meat/ eats egg whites and has cut back on ice cream  Eating more fruit and salads   Father -had CAD -later age in 51s    Vit D is 4- last time was over 100 Cut back this and calcium   Is supposed to get her zostavax today at her pharmacy- she has the px   Patient Active Problem List  Diagnosis  . HYPOTHYROIDISM  . HIATAL HERNIA  . CONSTIPATION  . IBS  . RECTAL BLEEDING  . HEPATIC CYST  . LEG CRAMPS, NOCTURNAL  . OSTEOPENIA  . BREAST CANCER, PERSONAL HX  . COLONIC POLYPS, HYPERPLASTIC, HX OF  . MIGRAINES, HX OF  . Hyperlipidemia  . Fungal nail infection  . Cervical dysplasia   Past Medical History  Diagnosis Date  . DUB (dysfunctional uterine bleeding)   . Adenomyosis   . 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 Personal history of malignant neoplasm of breast   . Malignant neoplasm of colon, unspecified site     hyperplastic  . Personal history of colonic polyps   . Other specified disorders of liver     Hepatic cyst  . Diaphragmatic  hernia without mention of obstruction or gangrene   . Unspecified hypothyroidism   . Irritable bowel syndrome   . Cramp of limb   . Hx of migraines   . Disorder of bone and cartilage, unspecified   . Colon polyps   . Hypothyroid   . Cervical dysplasia    Past Surgical History  Procedure Laterality Date  . Breast lumpectomy      right  . Breast enhancement surgery      bilateral  . Breast implant removal      bilateral  . Partial hysterectomy  1980    endometriosis  . Hemorroidectomy  1990's  . Tubal ligation    . Septoplasty    . Appendectomy  1965  . Skin biopsy    . Vaginal hysterectomy    . Gynecologic cryosurgery    . Colposcopy    . Augmentation mammaplasty     History  Substance Use Topics  . Smoking status: Never Smoker   . Smokeless tobacco: Never Used  . Alcohol Use: No   Family History  Problem Relation Age of Onset  . Uterine cancer Mother   . Aneurysm Mother 65  . Cancer Mother  uterine CA  . Colon cancer Maternal Aunt   . Breast cancer Maternal Aunt     Age 63's  . Heart disease Father   . Prostate cancer Father   . Cancer Father     prostate CA  . Colon polyps Brother   . Cancer Cousin     x 2 ? pancreatic  . Cancer Maternal Aunt     Brain tumor   Allergies  Allergen Reactions  . Codeine     REACTION: hallucinations   Current Outpatient Prescriptions on File Prior to Visit  Medication Sig Dispense Refill  . aspirin 81 MG chewable tablet Chew 81 mg by mouth daily.        Marland Kitchen BIOTIN PO Take by mouth.        . Calcium Carbonate-Vit D-Min (CALTRATE PLUS PO) Take 1 capsule by mouth daily.       . ciclopirox (PENLAC) 8 % solution Apply over nail and surrounding skin. Apply daily over previous coat. After seven (7) days, may remove with alcohol and continue cycle.  6.6 mL  11  . clonazePAM (KLONOPIN) 1 MG tablet TAKE ONE TABLET BY MOUTH AT BEDTIME  30 tablet  2  . DiphenhydrAMINE HCl (BENADRYL PO) Take by mouth as needed.        . fish  oil-omega-3 fatty acids 1000 MG capsule Take 2 g by mouth daily.       Marland Kitchen glucosamine-chondroitin 500-400 MG tablet Take 1 tablet by mouth 2 (two) times daily.       Marland Kitchen letrozole (FEMARA) 2.5 MG tablet Take 2.5 mg by mouth daily.      Marland Kitchen levothyroxine (SYNTHROID, LEVOTHROID) 75 MCG tablet Take 75 mcg by mouth daily.        . Multiple Vitamin (MULTIVITAMIN) tablet Take 1 tablet by mouth daily.        Marland Kitchen tretinoin (RETIN-A) 0.025 % cream Apply topically at bedtime. To apply to face  45 g  3  . zoster vaccine live, PF, (ZOSTAVAX) 16109 UNT/0.65ML injection Inject 19,400 Units into the skin once.  1 vial  0   No current facility-administered medications on file prior to visit.      Review of Systems Review of Systems  Constitutional: Negative for fever, appetite change, fatigue and unexpected weight change.  Eyes: Negative for pain and visual disturbance.  Respiratory: Negative for cough and shortness of breath.   Cardiovascular: Negative for cp or palpitations    Gastrointestinal: Negative for nausea, diarrhea and constipation.  Genitourinary: Negative for urgency and frequency.  Skin: Negative for pallor or rash   Neurological: Negative for weakness, light-headedness, numbness and headaches.  Hematological: Negative for adenopathy. Does not bruise/bleed easily.  Psychiatric/Behavioral: Negative for dysphoric mood. The patient is not nervous/anxious.         Objective:   Physical Exam  Constitutional: She appears well-developed and well-nourished. No distress.  HENT:  Head: Normocephalic and atraumatic.  Mouth/Throat: Oropharynx is clear and moist.  Eyes: Conjunctivae and EOM are normal. Pupils are equal, round, and reactive to light. No scleral icterus.  Neck: Normal range of motion. Neck supple. No JVD present. Carotid bruit is not present. No thyromegaly present.  Cardiovascular: Normal rate, regular rhythm and normal heart sounds.   Pulmonary/Chest: Effort normal and breath sounds  normal. No respiratory distress.  Abdominal: She exhibits no abdominal bruit.  Musculoskeletal: She exhibits no edema.  Lymphadenopathy:    She has no cervical adenopathy.  Neurological: She is alert. She has  normal reflexes.  Skin: Skin is warm and dry.  Psychiatric: She has a normal mood and affect.          Assessment & Plan:

## 2013-02-08 ENCOUNTER — Other Ambulatory Visit: Payer: Self-pay | Admitting: Oncology

## 2013-02-19 ENCOUNTER — Other Ambulatory Visit (HOSPITAL_BASED_OUTPATIENT_CLINIC_OR_DEPARTMENT_OTHER): Payer: Medicare Other | Admitting: Lab

## 2013-02-19 DIAGNOSIS — C50919 Malignant neoplasm of unspecified site of unspecified female breast: Secondary | ICD-10-CM

## 2013-02-19 LAB — CBC WITH DIFFERENTIAL/PLATELET
BASO%: 0.9 % (ref 0.0–2.0)
EOS%: 1.9 % (ref 0.0–7.0)
MCH: 31.8 pg (ref 25.1–34.0)
MCHC: 34.3 g/dL (ref 31.5–36.0)
NEUT%: 62 % (ref 38.4–76.8)
RDW: 13.2 % (ref 11.2–14.5)
lymph#: 1.2 10*3/uL (ref 0.9–3.3)

## 2013-02-20 ENCOUNTER — Other Ambulatory Visit: Payer: Self-pay | Admitting: Obstetrics and Gynecology

## 2013-02-20 ENCOUNTER — Ambulatory Visit: Payer: Medicare Other | Admitting: Oncology

## 2013-02-22 ENCOUNTER — Telehealth: Payer: Self-pay | Admitting: *Deleted

## 2013-02-22 ENCOUNTER — Ambulatory Visit: Payer: Medicare Other | Admitting: Oncology

## 2013-02-22 ENCOUNTER — Ambulatory Visit (HOSPITAL_BASED_OUTPATIENT_CLINIC_OR_DEPARTMENT_OTHER): Payer: Medicare Other | Admitting: Physician Assistant

## 2013-02-22 ENCOUNTER — Encounter: Payer: Self-pay | Admitting: Physician Assistant

## 2013-02-22 VITALS — BP 120/56 | HR 70 | Temp 98.0°F | Resp 20 | Ht 63.75 in | Wt 123.2 lb

## 2013-02-22 DIAGNOSIS — M949 Disorder of cartilage, unspecified: Secondary | ICD-10-CM

## 2013-02-22 DIAGNOSIS — Z853 Personal history of malignant neoplasm of breast: Secondary | ICD-10-CM

## 2013-02-22 DIAGNOSIS — M899 Disorder of bone, unspecified: Secondary | ICD-10-CM

## 2013-02-22 DIAGNOSIS — C50419 Malignant neoplasm of upper-outer quadrant of unspecified female breast: Secondary | ICD-10-CM

## 2013-02-22 MED ORDER — LETROZOLE 2.5 MG PO TABS: 2.5000 mg | ORAL_TABLET | Freq: Every day | ORAL | Status: DC

## 2013-02-22 NOTE — Telephone Encounter (Signed)
appts made and printed. Pt requested that her labs be in the same week as her appt. i also gv appt for [facility] for 08/15/13@ 3:30pm....td

## 2013-02-22 NOTE — Progress Notes (Signed)
ID: Alyssa Huffman   DOB: 03-14-1946  MR#: [id number]  YNW#:[id number]   PCP:  Roxy Manns, MD GYN:  Marcelle Overlie, MD SU:  Avel Peace, MD Other:  Lina Sar, MD   HISTORY OF PRESENT ILLNESS:  The patient has been carefully evaluated through the [facility] and in particular December 18, 2008 she underwent ultrasound guided biopsy of the right breast palpable mass.  On December 10, 2008 she underwent mammography and ultrasonography for a right breast mass, which was 1.5 cm in the axillary portion of the right breast with spiculated borders.  It was echogenic measured 1.6 cm by ultrasound and biopsy was recommended and performed on December 18, 2008.  However, that biopsy ([id number]) showed only a benign breast parenchyma.  Dr. Yolanda Bonine remained concerned that the actual mass may have been missed and she suggested excisional biopsy.  A second ultrasound on April 21 was performed just to make sure that the correct mass was biopsied and indeed that seemed to be the case.  At any rate the patient underwent bilateral breast MRIs on February 11, 2009 and this confirmed the presence of a 1.7 cm area of enhancement deep in the right breast with no other suspicious areas.  There were two areas in the liver, which were felt likely to be cysts.  With this information the patient was referred to Dr. Abbey Chatters and after appropriate discussion she underwent right lumpectomy as well as excision of the left shoulder lipoma March 20, 2009.  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  Sentinel lymph node sampling was not done on that procedure since there had not been a prior pathologic diagnosis of breast cancer, but was ultimately performed July 20 with the pathology there ([id number]) showing 3 sentinel lymph nodes negative for tumor.  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 Her subsequent history is as detailed below.  INTERVAL HISTORY: Raziya returns today for followup of her right breast cancer. Interval history is unremarkable, and Lis is doing well. She continues to tolerate the letrozole with the exception of continued hot flashes and vaginal dryness. She denies vaginal bleeding. She denies any increased joint pain associated with aromatase inhibitors.   Since her last appointment here, Caroly had a breast MRI on 09/05/2012 which was unremarkable.  She is seeing Dr. Vincente Poli for GYN needs and sees Dr. Milinda Antis regularly for primary care.   REVIEW OF SYSTEMS: Zoeann denies any recent illnesses, fevers, or chills. She's had no skin changes or rashes and denies abnormal bleeding. No nausea or emesis and no change in bowel habits.  Karlisa denies any increased shortness of breath and has had no chest pain. No abnormal headaches or dizziness. She has some lower back pain for which she takes an occasional dose of Aleve, and this seems to improve. Otherwise, she denies any unusual myalgias, arthralgias, or bony pain, and has had no peripheral swelling.  A detailed review of systems is otherwise stable and noncontributory.    PAST MEDICAL HISTORY: Past Medical History  Diagnosis Date  . DUB (dysfunctional uterine bleeding)   . Adenomyosis   . 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 Personal history of malignant neoplasm of breast   . Malignant neoplasm of colon, unspecified site     hyperplastic  . Personal history of  colonic polyps   . Other specified disorders of liver     Hepatic cyst  . Diaphragmatic hernia without mention of obstruction or gangrene   . Unspecified hypothyroidism   . Irritable bowel syndrome   . Cramp of limb   . Hx of migraines   . Disorder of bone and cartilage, unspecified   . Colon polyps   . Hypothyroid   . Cervical dysplasia    osteopenia, early cataracts,  hypothyroidism, history of a ruptured C5 disc, history of migraines, history of breast augmentation with later explantation (for possible rupture), status post hysterectomy with no salpingo-oophorectomy (the procedure was performed for endometriosis when the patient was 67 years old), history of appendectomy, history of skin biopsy in the right lower extremity for a "nonmalignant" skin area.  In addition, a chest x-ray preop showed possible COPD in this nonsmoker.  PAST SURGICAL HISTORY: Past Surgical History  Procedure Laterality Date  . Breast lumpectomy      right  . Breast enhancement surgery      bilateral  . Breast implant removal      bilateral  . Partial hysterectomy  1980    endometriosis  . Hemorroidectomy  1990's  . Tubal ligation    . Septoplasty    . Appendectomy  1965  . Skin biopsy    . Vaginal hysterectomy    . Gynecologic cryosurgery    . Colposcopy    . Augmentation mammaplasty      FAMILY HISTORY Family History  Problem Relation Age of Onset  . Uterine cancer Mother   . Aneurysm Mother 3  . Cancer Mother     uterine CA  . Colon cancer Maternal Aunt   . Breast cancer Maternal Aunt     Age 67's  . Heart disease Father   . Prostate cancer Father   . Cancer Father     prostate CA  . Colon polyps Brother   . Cancer Cousin     x 2 ? pancreatic  . Cancer Maternal Aunt     Brain tumor  The patient's father is alive at age 69.  He is status post MI.  The patient's mother died at age 74.  She had a ruptured cerebral aneurysm and she had a history of uterine cancer, which she survived.  The patient has one sister and two brothers with no cancer.  The patient's mother had five sisters, one of whom had breast cancer diagnosed in her 42s.  There is no history of ovarian cancer in the family.  GYNECOLOGIC HISTORY: The patient is Gx, P2, first pregnancy to term age 65.  She had a hysterectomy age 59 as noted.  She took hormones for approximately 15 years, stopping at  the time of this diagnosis.  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 Son Nida Boatman here in [place] is a Firefighter; son Dealer in [place] also as a Radiation protection practitioner. Patient has a 65 year old grandson. She attends first [employer]  ADVANCED DIRECTIVES: not in place  HEALTH MAINTENANCE: History  Substance Use Topics  . Smoking status: Never Smoker   . Smokeless tobacco: Never Used  . Alcohol Use: No     Colonoscopy:  PAP:  June 2014, normal  Bone density:  May 2012, osteopenia  Lipid panel: "Borderline high", Dr. Milinda Antis   Allergies  Allergen Reactions  . Codeine  REACTION: hallucinations    Current Outpatient Prescriptions  Medication Sig Dispense Refill  . aspirin 81 MG chewable tablet Chew 81 mg by mouth daily.        Marland Kitchen BIOTIN PO Take by mouth.        . Calcium Carbonate-Vit D-Min (CALTRATE PLUS PO) Take 1 capsule by mouth daily.       . Cholecalciferol 2000 UNITS TABS Take 1 tablet by mouth daily.      . ciclopirox (PENLAC) 8 % solution Apply over nail and surrounding skin. Apply daily over previous coat. After seven (7) days, may remove with alcohol and continue cycle.  6.6 mL  11  . clonazePAM (KLONOPIN) 1 MG tablet TAKE ONE TABLET BY MOUTH AT BEDTIME  30 tablet  2  . DiphenhydrAMINE HCl (BENADRYL PO) Take by mouth as needed.        . fish oil-omega-3 fatty acids 1000 MG capsule Take 2 g by mouth daily.       Marland Kitchen glucosamine-chondroitin 500-400 MG tablet Take 1 tablet by mouth 2 (two) times daily.       Marland Kitchen letrozole (FEMARA) 2.5 MG tablet Take 2.5 mg by mouth daily.      Marland Kitchen levothyroxine (SYNTHROID, LEVOTHROID) 75 MCG tablet Take 75 mcg by mouth daily.        . Multiple Vitamin (MULTIVITAMIN) tablet Take 1 tablet by mouth daily.        Marland Kitchen tretinoin (RETIN-A) 0.025 % cream Apply topically at bedtime. To apply to face  45 g  3  .  vitamin C (ASCORBIC ACID) 500 MG tablet Take 500 mg by mouth daily.       No current facility-administered medications for this visit.    OBJECTIVE: Middle-aged white woman who appears well Filed Vitals:   02/22/13 1011  BP: 120/56  Pulse: 70  Temp: 98 F (36.7 C)  Resp: 20     Body mass index is 21.32 kg/(m^2).    ECOG FS: 0 Filed Weights   02/22/13 1011  Weight: 123 lb 3.2 oz (55.883 kg)    Sclerae unicteric Oropharynx clear No cervical or supraclavicular adenopathy Lungs clear to auscultation, no rales or rhonchi Heart regular rate and rhythm Abdomen soft, nontender to palpation, positive bowel sounds MSK no focal spinal tenderness, no peripheral edema Neuro: nonfocal, well oriented with positive affect Breasts: Right breast is status post lumpectomy with no evidence of disease recurrence. Left breast is status post mammoplasty and is otherwise unremarkable. Breast tissue is dense bilaterally. Axillae are benign bilaterally with no adenopathy.    LAB RESULTS: Lab Results  Component Value Date   WBC 4.7 02/19/2013   NEUTROABS 2.9 02/19/2013   HGB 13.6 02/19/2013   HCT 39.7 02/19/2013   MCV 92.7 02/19/2013   PLT 267 02/19/2013      Chemistry      Component Value Date/Time   NA 141 09/11/2012 1303   NA 137 08/24/2012 1500   K 4.8 09/11/2012 1303   K 4.7 08/24/2012 1500   CL 102 09/11/2012 1303   CL 99 08/24/2012 1500   CO2 29 09/11/2012 1303   CO2 29 08/24/2012 1500   BUN 9 09/11/2012 1303   BUN 10.0 08/24/2012 1500   CREATININE 0.7 09/11/2012 1303   CREATININE 0.9 08/24/2012 1500      Component Value Date/Time   CALCIUM 9.6 09/11/2012 1303   CALCIUM 10.5* 08/24/2012 1500   ALKPHOS 43 09/11/2012 1303   ALKPHOS 56 08/24/2012 1500   AST  23 09/11/2012 1303   AST 22 08/24/2012 1500   ALT 19 09/11/2012 1303   ALT 18 08/24/2012 1500   BILITOT 0.5 09/11/2012 1303   BILITOT 0.51 08/24/2012 1500       Lab Results  Component Value Date   LABCA2 31 08/24/2012     STUDIES:  09/05/2012 *RADIOLOGY REPORT*  Clinical Data: Pain and tenderness in the lateral right breast.  Status post right lumpectomy and radiation therapy for breast  cancer in 2010. The patient has had previous bilateral breast  implants removed.  BILATERAL BREAST MRI WITH AND WITHOUT CONTRAST  Technique: Multiplanar, multisequence MR images of both breasts  were obtained prior to and following the intravenous administration  of 11ml of MultiHance. Three dimensional images were evaluated at  the independent DynaCad workstation.  Comparison: Previous examinations, including the left diagnostic  mammogram obtained at [facility] on 08/07/2012 and right  diagnostic mammogram obtained at [facility] on  08/31/2012. Previous breast MR examinations dated 02/16/2011 and  02/11/2009.  Findings: Mild background parenchymal enhancement both breasts.  Stable post lumpectomy changes on the right and old breast implant  capsules in the posterior aspects of both breasts. A postoperative  seroma in the posterior right breast is smaller than on 02/16/2011.  No masses or areas of enhancement suspicious for malignancy in  either breast. No abnormal appearing lymph nodes. No significant  change in two small cysts in the liver on the right.  IMPRESSION:  No evidence of malignancy.  RECOMMENDATION:  Bilateral diagnostic mammogram in 11 months.  THREE-DIMENSIONAL MR IMAGE RENDERING ON INDEPENDENT WORKSTATION:  Three-dimensional MR images were rendered by post-processing of the  original MR data on an independent workstation. The three-  dimensional MR images were interpreted, and findings were reported  in the accompanying complete MRI report for this study.  BI-RADS CATEGORY 2: Benign finding(s).  Original Report Authenticated By: Beckie Salts, M.D.    Most recent bone density in May 2012 showed osteopenia.  Bone Density is actually scheduled to be repeated this  week.    ASSESSMENT: 67 y.o. [place] woman   (1)  status post right lumpectomy and sentinel lymph node biopsy in February 15, 2009 for a T1c N0, grade 1 invasive ductal carcinoma.  Tumor was ER/PR positive, HER-2/neu negative with a low MIB-1.    (2)  Status post radiation completed in October 2010.    (3)  Briefly on tamoxifen and anastrozole, both with poor tolerance.  On letrozole since July 2011 and continuing with very good tolerance.  The plan is to continue letrozole for a total of 5 years.   PLAN:  With regards to her breast cancer, Nikitia is doing well with no clinical evidence of disease recurrence at this time. She will continue on the letrozole which I have refilled for another year. She is actually scheduled for repeat bone density later this week, and will be due for her next bilateral mammogram in November of this year. She return to see [place] in proximally 6 months, but knows to call prior that time with any changes or problems.  Of note, we did spend quite a bit of time discussing her problems with vaginal dryness, and I have recommended trying coconut oil, vitamin E capsules, or Replens.     Taquita Demby    02/22/2013

## 2013-02-26 ENCOUNTER — Ambulatory Visit: Payer: Medicare Other | Admitting: Oncology

## 2013-02-28 ENCOUNTER — Encounter: Payer: Self-pay | Admitting: Family Medicine

## 2013-03-05 ENCOUNTER — Encounter: Payer: Self-pay | Admitting: Family Medicine

## 2013-03-05 ENCOUNTER — Encounter: Payer: Self-pay | Admitting: *Deleted

## 2013-03-13 ENCOUNTER — Other Ambulatory Visit: Payer: Self-pay | Admitting: *Deleted

## 2013-03-13 DIAGNOSIS — C50911 Malignant neoplasm of unspecified site of right female breast: Secondary | ICD-10-CM

## 2013-03-13 MED ORDER — CLONAZEPAM 1 MG PO TABS: 0.5000 mg | ORAL_TABLET | Freq: Every evening | ORAL | Status: DC | PRN

## 2013-03-22 ENCOUNTER — Other Ambulatory Visit: Payer: Self-pay | Admitting: *Deleted

## 2013-03-22 DIAGNOSIS — C50911 Malignant neoplasm of unspecified site of right female breast: Secondary | ICD-10-CM

## 2013-03-22 MED ORDER — CLONAZEPAM 1 MG PO TABS: 1.0000 mg | ORAL_TABLET | Freq: Every evening | ORAL | Status: DC | PRN

## 2013-05-01 ENCOUNTER — Other Ambulatory Visit: Payer: Self-pay | Admitting: Physician Assistant

## 2013-05-01 DIAGNOSIS — Z853 Personal history of malignant neoplasm of breast: Secondary | ICD-10-CM

## 2013-05-01 DIAGNOSIS — M899 Disorder of bone, unspecified: Secondary | ICD-10-CM

## 2013-05-09 ENCOUNTER — Telehealth: Payer: Self-pay | Admitting: Family Medicine

## 2013-05-09 DIAGNOSIS — M21612 Bunion of left foot: Secondary | ICD-10-CM | POA: Insufficient documentation

## 2013-05-09 NOTE — Telephone Encounter (Signed)
Pt has a painful bunion on her left foot and would like a referral to a podiatrist to have it checked.  Can you assist her w/a referral? She prefers someone in [place].  Thank you.

## 2013-05-09 NOTE — Telephone Encounter (Signed)
Left voicemail letting pt know referral made and Shirlee Limerick will call to get appt scheduled

## 2013-05-09 NOTE — Telephone Encounter (Signed)
Referral done  Let her know she should get a call in the next few days from our ref coordinator

## 2013-05-15 ENCOUNTER — Telehealth: Payer: Self-pay | Admitting: *Deleted

## 2013-05-15 NOTE — Telephone Encounter (Signed)
This RN spoke with pt per her call wanting to clarify appt scheduled later this week. Per conversation pt is unsure why she needs a lab " since I am coming in for a consultation only and had labs done in June?"  Per phone conversation plan is to do labs after visit if needed.

## 2013-05-17 ENCOUNTER — Encounter: Payer: Self-pay | Admitting: Family Medicine

## 2013-05-18 ENCOUNTER — Other Ambulatory Visit: Payer: Medicare Other | Admitting: Lab

## 2013-05-18 ENCOUNTER — Telehealth: Payer: Self-pay | Admitting: Oncology

## 2013-05-18 ENCOUNTER — Encounter: Payer: Self-pay | Admitting: Physician Assistant

## 2013-05-18 ENCOUNTER — Ambulatory Visit (HOSPITAL_BASED_OUTPATIENT_CLINIC_OR_DEPARTMENT_OTHER): Payer: Medicare Other | Admitting: Physician Assistant

## 2013-05-18 VITALS — BP 117/74 | HR 68 | Temp 98.2°F | Resp 18 | Ht 63.75 in | Wt 123.8 lb

## 2013-05-18 DIAGNOSIS — M549 Dorsalgia, unspecified: Secondary | ICD-10-CM

## 2013-05-18 DIAGNOSIS — C50419 Malignant neoplasm of upper-outer quadrant of unspecified female breast: Secondary | ICD-10-CM

## 2013-05-18 DIAGNOSIS — Z853 Personal history of malignant neoplasm of breast: Secondary | ICD-10-CM

## 2013-05-18 DIAGNOSIS — M899 Disorder of bone, unspecified: Secondary | ICD-10-CM

## 2013-05-18 DIAGNOSIS — Z170 Estrogen receptor positive status [ER+]: Secondary | ICD-10-CM

## 2013-05-18 DIAGNOSIS — M810 Age-related osteoporosis without current pathological fracture: Secondary | ICD-10-CM

## 2013-05-18 HISTORY — DX: Age-related osteoporosis without current pathological fracture: M81.0

## 2013-05-18 NOTE — Progress Notes (Signed)
ID: Alyssa Huffman   DOB: 01/15/1946  MR#: [id number]  NWG#:[id number]   PCP:  Alyssa Manns, MD GYN:  Alyssa Overlie, MD SU:  Alyssa Peace, MD Other:  Alyssa Sar, MD   HISTORY OF PRESENT ILLNESS:  The patient has been carefully evaluated through the [facility] and in particular December 18, 2008 she underwent ultrasound guided biopsy of the right breast palpable mass.  On December 10, 2008 she underwent mammography and ultrasonography for a right breast mass, which was 1.5 cm in the axillary portion of the right breast with spiculated borders.  It was echogenic measured 1.6 cm by ultrasound and biopsy was recommended and performed on December 18, 2008.  However, that biopsy ([id number]) showed only a benign breast parenchyma.  Dr. Yolanda Huffman remained concerned that the actual mass may have been missed and she suggested excisional biopsy.  A second ultrasound on April 21 was performed just to make sure that the correct mass was biopsied and indeed that seemed to be the case.  At any rate the patient underwent bilateral breast MRIs on February 11, 2009 and this confirmed the presence of a 1.7 cm area of enhancement deep in the right breast with no other suspicious areas.  There were two areas in the liver, which were felt likely to be cysts.  With this information the patient was referred to Dr. Abbey Huffman and after appropriate discussion she underwent right lumpectomy as well as excision of the left shoulder lipoma March 20, 2009.  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  Sentinel lymph node sampling was not done on that procedure since there had not been a prior pathologic diagnosis of breast cancer, but was ultimately performed July 20 with the pathology there ([id number]) showing 3 sentinel lymph nodes negative for tumor.  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 Her subsequent history is as detailed below.  INTERVAL HISTORY: "Alyssa Huffman" returns today accompanied by her husband Alyssa Huffman for followup of her right breast cancer. Interval history is unremarkable, and Alyssa Huffman is feeling well overall.  . She continues to tolerate the letrozole with the exception of continued hot flashes and vaginal dryness.  She denies any increased joint pain associated with aromatase inhibitors. She is exercising 3-4 times weekly on the elliptical at the gym.  Interval history is notable for the fact that she had a repeat bone density in June which showed mild osteoporosis. She is here today to discuss the possibility of treatment with bisphosphonates.   REVIEW OF SYSTEMS: Alyssa Huffman denies any recent illnesses, fevers, or chills. She's eating well with no nausea or emesis and no change in bowel habits.  Alyssa Huffman denies any increased shortness of breath and has had no chest pain. No abnormal headaches or dizziness. She has some lower back pain for which she takes an occasional dose of Aleve, and this seems to improve. Otherwise, she denies any unusual myalgias, arthralgias, or bony pain, and has had no peripheral swelling.  A detailed review of systems is otherwise stable and noncontributory.    PAST MEDICAL HISTORY: Past Medical History  Diagnosis Date  . DUB (dysfunctional uterine bleeding)   . Adenomyosis   . 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 Personal history of malignant neoplasm of breast   . Malignant neoplasm of colon, unspecified site  hyperplastic  . Personal history of colonic polyps   . Other specified disorders of liver     Hepatic cyst  . Diaphragmatic hernia without mention of obstruction or gangrene   . Unspecified hypothyroidism   . Irritable bowel syndrome   . Cramp of limb   . Hx of migraines   . Disorder of bone and cartilage, unspecified   . Colon polyps   . Hypothyroid   .  Cervical dysplasia   . Osteoporosis, unspecified 05/18/2013   osteopenia, early cataracts, hypothyroidism, history of a ruptured C5 disc, history of migraines, history of breast augmentation with later explantation (for possible rupture), status post hysterectomy with no salpingo-oophorectomy (the procedure was performed for endometriosis when the patient was 67 years old), history of appendectomy, history of skin biopsy in the right lower extremity for a "nonmalignant" skin area.  In addition, a chest x-ray preop showed possible COPD in this nonsmoker.  PAST SURGICAL HISTORY: Past Surgical History  Procedure Laterality Date  . Breast lumpectomy      right  . Breast enhancement surgery      bilateral  . Breast implant removal      bilateral  . Partial hysterectomy  1980    endometriosis  . Hemorroidectomy  1990's  . Tubal ligation    . Septoplasty    . Appendectomy  1965  . Skin biopsy    . Vaginal hysterectomy    . Gynecologic cryosurgery    . Colposcopy    . Augmentation mammaplasty      FAMILY HISTORY Family History  Problem Relation Age of Onset  . Uterine cancer Mother   . Aneurysm Mother 62  . Cancer Mother     uterine CA  . Colon cancer Maternal Aunt   . Breast cancer Maternal Aunt     Age 65's  . Heart disease Father   . Prostate cancer Father   . Cancer Father     prostate CA  . Colon polyps Brother   . Cancer Cousin     x 2 ? pancreatic  . Cancer Maternal Aunt     Brain tumor  The patient's father is alive at age 54.  He is status post MI.  The patient's mother died at age 70.  She had a ruptured cerebral aneurysm and she had a history of uterine cancer, which she survived.  The patient has one sister and two brothers with no cancer.  The patient's mother had five sisters, one of whom had breast cancer diagnosed in her 41s.  There is no history of ovarian cancer in the family.  GYNECOLOGIC HISTORY: The patient is Gx, P2, first pregnancy to term age 36.  She had  a hysterectomy age 5 as noted.  She took hormones for approximately 15 years, stopping at the time of this diagnosis.  SOCIAL HISTORY: She is a retired Pharmacist, hospital, used to run a [employer]; husband Alyssa Huffman was Warehouse manager of a company working with IT, now is a Research scientist (medical). Son Nida Boatman here in [place] is a Firefighter; son Dealer in [place] also as a Radiation protection practitioner. Patient has a 26 year old grandson. She attends first [employer] [employer]  ADVANCED DIRECTIVES: not in place  HEALTH MAINTENANCE: History  Substance Use Topics  . Smoking status: Never Smoker   . Smokeless tobacco: Never Used  . Alcohol Use: No     Colonoscopy:  PAP:  June 2014, normal  Bone density:  June 2014, osteoporosis  Lipid panel: "Borderline high", Dr.  Tower   Allergies  Allergen Reactions  . Codeine     REACTION: hallucinations    Current Outpatient Prescriptions  Medication Sig Dispense Refill  . aspirin 81 MG chewable tablet Chew 81 mg by mouth daily.        Marland Kitchen BIOTIN PO Take by mouth.        . Calcium Carbonate-Vit D-Min (CALTRATE PLUS PO) Take 1 capsule by mouth daily.       . Cholecalciferol 2000 UNITS TABS Take 1 tablet by mouth daily.      . ciclopirox (PENLAC) 8 % solution Apply over nail and surrounding skin. Apply daily over previous coat. After seven (7) days, may remove with alcohol and continue cycle.  6.6 mL  11  . clonazePAM (KLONOPIN) 1 MG tablet Take 1 tablet (1 mg total) by mouth at bedtime as needed for anxiety.  30 tablet  2  . DiphenhydrAMINE HCl (BENADRYL PO) Take by mouth as needed.        . fish oil-omega-3 fatty acids 1000 MG capsule Take 2 g by mouth daily.       Marland Kitchen glucosamine-chondroitin 500-400 MG tablet Take 1 tablet by mouth 2 (two) times daily.       Marland Kitchen letrozole (FEMARA) 2.5 MG tablet Take 1 tablet (2.5 mg total) by mouth daily.  90 tablet  3  . levothyroxine (SYNTHROID, LEVOTHROID) 75 MCG tablet Take 75 mcg by mouth daily.        . Multiple  Vitamin (MULTIVITAMIN) tablet Take 1 tablet by mouth daily.        Marland Kitchen tretinoin (RETIN-A) 0.025 % cream Apply topically at bedtime. To apply to face  45 g  3  . vitamin C (ASCORBIC ACID) 500 MG tablet Take 500 mg by mouth daily.       No current facility-administered medications for this visit.    OBJECTIVE: Middle-aged white woman who appears well Filed Vitals:   05/18/13 1116  BP: 117/74  Pulse: 68  Temp: 98.2 F (36.8 C)  Resp: 18     Body mass index is 21.42 kg/(m^2).    ECOG FS: 0 Filed Weights   05/18/13 1116  Weight: 123 lb 12.8 oz (56.155 kg)   Physical exam was deferred today.   LAB RESULTS: Lab Results  Component Value Date   WBC 4.7 02/19/2013   NEUTROABS 2.9 02/19/2013   HGB 13.6 02/19/2013   HCT 39.7 02/19/2013   MCV 92.7 02/19/2013   PLT 267 02/19/2013      Chemistry      Component Value Date/Time   NA 141 09/11/2012 1303   NA 137 08/24/2012 1500   K 4.8 09/11/2012 1303   K 4.7 08/24/2012 1500   CL 102 09/11/2012 1303   CL 99 08/24/2012 1500   CO2 29 09/11/2012 1303   CO2 29 08/24/2012 1500   BUN 9 09/11/2012 1303   BUN 10.0 08/24/2012 1500   CREATININE 0.7 09/11/2012 1303   CREATININE 0.9 08/24/2012 1500      Component Value Date/Time   CALCIUM 9.6 09/11/2012 1303   CALCIUM 10.5* 08/24/2012 1500   ALKPHOS 43 09/11/2012 1303   ALKPHOS 56 08/24/2012 1500   AST 23 09/11/2012 1303   AST 22 08/24/2012 1500   ALT 19 09/11/2012 1303   ALT 18 08/24/2012 1500   BILITOT 0.5 09/11/2012 1303   BILITOT 0.51 08/24/2012 1500       Lab Results  Component Value Date   LABCA2 31 08/24/2012  STUDIES:  Most recent bone density in June 2014 at [facility] showed mild osteoporosis with a T score of -2.7 in the spine.    ASSESSMENT: 67 y.o. [place] woman   (1)  status post right lumpectomy and sentinel lymph node biopsy in February 15, 2009 for a T1c N0, grade 1 invasive ductal carcinoma.  Tumor was ER/PR positive, HER-2/neu negative with a low MIB-1.    (2)   Status post radiation completed in October 2010.    (3)  Briefly on tamoxifen and anastrozole, both with poor tolerance.  On letrozole since July 2011 and continuing with very good tolerance.  The plan is to continue antiestrogen therapy for a total of 5 years, until October 2015.   (4)  Osteoporosis, last bone density in June 2014 at [facility]:  With regards to her breast cancer, Alyssa Huffman will continue on letrozole as before. Her entire appointment today was spent counseling her regarding her bone density results, discussing treatment options, and coordinating care.  Her bone density on 02/22/2013 did show some worsening, now with mild osteoporosis in the spine with a T score of -2.7. She still has only osteopenia in the hips. She is very hesitant to begin bisphosphonate treatment due to her very poor tolerance of Fosamax. (Recall that she had no problems with esophageal discomfort, but developed severe muscle and joint pain which limited her daily activities. The pain resolved shortly after discontinuing Fosamax.) She's already taking calcium and vitamin D. She agrees to walk 30-45 minutes at least 5 days weekly between now and her appointment with Dr. Darnelle Catalan in December, and they will revisit the subject at that time. For now, she would like to avoid the bisphosphonate treatments, especially since she has only one year remaining on the letrozole.  Alyssa Huffman is due for her next mammogram at [facility] in November, and return for labs and physical exam with Dr. Darnelle Catalan in December. She and her husband both voice understanding and agreement with this plan, and she will call with any changes or problems prior to that appointment.  Linder Prajapati    05/18/2013

## 2013-06-12 ENCOUNTER — Telehealth: Payer: Self-pay | Admitting: *Deleted

## 2013-07-26 ENCOUNTER — Ambulatory Visit: Payer: Self-pay | Admitting: Podiatrist

## 2013-08-15 ENCOUNTER — Ambulatory Visit: Payer: Self-pay | Admitting: Podiatrist

## 2013-08-20 ENCOUNTER — Other Ambulatory Visit (HOSPITAL_BASED_OUTPATIENT_CLINIC_OR_DEPARTMENT_OTHER): Payer: Medicare Other

## 2013-08-20 DIAGNOSIS — C50419 Malignant neoplasm of upper-outer quadrant of unspecified female breast: Secondary | ICD-10-CM

## 2013-08-20 DIAGNOSIS — Z853 Personal history of malignant neoplasm of breast: Secondary | ICD-10-CM

## 2013-08-20 LAB — CBC WITH DIFFERENTIAL/PLATELET
BASO%: 1 % (ref 0.0–2.0)
EOS%: 2.9 % (ref 0.0–7.0)
HCT: 39.8 % (ref 34.8–46.6)
LYMPH%: 25.8 % (ref 14.0–49.7)
MCH: 31.8 pg (ref 25.1–34.0)
MCHC: 33.8 g/dL (ref 31.5–36.0)
MONO#: 0.4 10*3/uL (ref 0.1–0.9)
MONO%: 9.5 % (ref 0.0–14.0)
NEUT%: 60.8 % (ref 38.4–76.8)
Platelets: 273 10*3/uL (ref 145–400)
RBC: 4.23 10*6/uL (ref 3.70–5.45)
WBC: 3.9 10*3/uL (ref 3.9–10.3)

## 2013-08-20 LAB — COMPREHENSIVE METABOLIC PANEL (CC13)
ALT: 17 U/L (ref 0–55)
Albumin: 4.4 g/dL (ref 3.5–5.0)
Anion Gap: 11 mEq/L (ref 3–11)
CO2: 26 mEq/L (ref 22–29)
Calcium: 10.3 mg/dL (ref 8.4–10.4)
Chloride: 103 mEq/L (ref 98–109)
Glucose: 87 mg/dl (ref 70–140)
Potassium: 4.6 mEq/L (ref 3.5–5.1)
Sodium: 140 mEq/L (ref 136–145)
Total Protein: 7.8 g/dL (ref 6.4–8.3)

## 2013-08-21 ENCOUNTER — Telehealth: Payer: Self-pay | Admitting: Oncology

## 2013-08-22 ENCOUNTER — Ambulatory Visit: Payer: Self-pay | Admitting: Podiatrist

## 2013-08-23 ENCOUNTER — Encounter (INDEPENDENT_AMBULATORY_CARE_PROVIDER_SITE_OTHER): Payer: Self-pay

## 2013-08-23 ENCOUNTER — Ambulatory Visit: Payer: Medicare Other | Admitting: Oncology

## 2013-08-23 ENCOUNTER — Ambulatory Visit (HOSPITAL_BASED_OUTPATIENT_CLINIC_OR_DEPARTMENT_OTHER): Payer: Medicare Other | Admitting: Oncology

## 2013-08-23 ENCOUNTER — Telehealth: Payer: Self-pay | Admitting: Oncology

## 2013-08-23 VITALS — BP 117/70 | HR 66 | Temp 98.1°F | Resp 18 | Ht 63.75 in | Wt 124.0 lb

## 2013-08-23 DIAGNOSIS — C50419 Malignant neoplasm of upper-outer quadrant of unspecified female breast: Secondary | ICD-10-CM

## 2013-08-23 DIAGNOSIS — Z170 Estrogen receptor positive status [ER+]: Secondary | ICD-10-CM

## 2013-08-23 DIAGNOSIS — M810 Age-related osteoporosis without current pathological fracture: Secondary | ICD-10-CM

## 2013-08-23 DIAGNOSIS — C50411 Malignant neoplasm of upper-outer quadrant of right female breast: Secondary | ICD-10-CM

## 2013-08-23 NOTE — Progress Notes (Signed)
ID: Alyssa Huffman   DOB: 10-06-1945  MR#: [id number]  WUJ#:[id number]   PCP:  Roxy Manns, MD GYN:  Marcelle Overlie, MD SU:  Avel Peace, MD Other:  Lina Sar, MD   HISTORY OF PRESENT ILLNESS:  The patient had been carefully evaluated through the [facility] and in particular  December 10, 2008 she underwent mammography and ultrasonography for a right breast mass, which was 1.5 cm in the axillary portion of the right breast with spiculated borders.  It was echogenic measured 1.6 cm by ultrasound and biopsy was recommended and performed on December 18, 2008.  However, that biopsy ([id number]) showed only a benign breast parenchyma.  Dr. Yolanda Bonine remained concerned that the actual mass may have been missed and she suggested excisional biopsy.  A second ultrasound on April 21 was performed just to make sure that the correct mass was biopsied and indeed that seemed to be the case.  At any rate the patient underwent bilateral breast MRIs on February 11, 2009 and this confirmed the presence of a 1.7 cm area of enhancement deep in the right breast with no other suspicious areas.  There were two areas in the liver, which were felt likely to be cysts.  With this information the patient was referred to Dr. Abbey Chatters and after appropriate discussion she underwent right lumpectomy as well as excision of the left shoulder lipoma March 20, 2009.  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  Sentinel lymph node sampling was not done on that procedure since there had not been a prior pathologic diagnosis of breast cancer, but was ultimately performed July 20 with the pathology there ([id number]) showing 3 sentinel lymph nodes negative for tumor.  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 Her subsequent history is as detailed  below.  INTERVAL HISTORY: "Alyssa Huffman" returns today for followup of her right breast cancer. Interval history is unremarkable, she is tolerating the letrozole generally well. She does have hot flashes and vaginal dryness issues. These are no worse than before.  REVIEW OF SYSTEMS: She's developed some "lumps" in her right leg and she wanted me to look at these again. She had dropped in a few months ago and they seem to me to be simply slightly irregular fatty deposits in her thighs. She recently changed from an elliptical to a treadmill because she was told that would be better for her bones. Otherwise a detailed review of systems today was entirely negative.   PAST MEDICAL HISTORY: Past Medical History  Diagnosis Date  . DUB (dysfunctional uterine bleeding)   . Adenomyosis   . 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 Personal history of malignant neoplasm of breast   . Malignant neoplasm of colon, unspecified site     hyperplastic  . Personal history of colonic polyps   . Other specified disorders of liver     Hepatic cyst  . Diaphragmatic hernia without mention of obstruction or gangrene   . Unspecified hypothyroidism   . Irritable bowel syndrome   . Cramp of limb   . Hx of migraines   . Disorder of bone and cartilage, unspecified   . Colon polyps   . Hypothyroid   . Cervical dysplasia   . Osteoporosis, unspecified 05/18/2013   osteopenia, early cataracts, hypothyroidism, history of a ruptured C5  disc, history of migraines, history of breast augmentation with later explantation (for possible rupture), status post hysterectomy with no salpingo-oophorectomy (the procedure was performed for endometriosis when the patient was 67 years old), history of appendectomy, history of skin biopsy in the right lower extremity for a "nonmalignant" skin area.  In addition, a chest x-ray preop showed possible COPD in this nonsmoker.  PAST SURGICAL HISTORY: Past Surgical  History  Procedure Laterality Date  . Breast lumpectomy      right  . Breast enhancement surgery      bilateral  . Breast implant removal      bilateral  . Partial hysterectomy  1980    endometriosis  . Hemorroidectomy  1990's  . Tubal ligation    . Septoplasty    . Appendectomy  1965  . Skin biopsy    . Vaginal hysterectomy    . Gynecologic cryosurgery    . Colposcopy    . Augmentation mammaplasty      FAMILY HISTORY Family History  Problem Relation Age of Onset  . Uterine cancer Mother   . Aneurysm Mother 98  . Cancer Mother     uterine CA  . Colon cancer Maternal Aunt   . Breast cancer Maternal Aunt     Age 47's  . Heart disease Father   . Prostate cancer Father   . Cancer Father     prostate CA  . Colon polyps Brother   . Cancer Cousin     x 2 ? pancreatic  . Cancer Maternal Aunt     Brain tumor  The patient's father is alive at age 76.  He is status post MI.  The patient's mother died at age 41.  She had a ruptured cerebral aneurysm and she had a history of uterine cancer, which she survived.  The patient has one sister and two brothers with no cancer.  The patient's mother had five sisters, one of whom had breast cancer diagnosed in her 52s.  There is no history of ovarian cancer in the family.  GYNECOLOGIC HISTORY: The patient is Gx, P2, first pregnancy to term age 27.  She had a hysterectomy age 80 as noted.  She took hormones for approximately 15 years, stopping at the time of this diagnosis.  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 Son Nida Boatman here in [place] is a Firefighter; son Dealer in [place] also as a Radiation protection practitioner. Patient has a 20 year old grandson. She attends first [employer]  ADVANCED DIRECTIVES: not in place  HEALTH MAINTENANCE: History  Substance Use Topics  . Smoking status: Never Smoker    . Smokeless tobacco: Never Used  . Alcohol Use: No     Colonoscopy:  PAP:  June 2014, normal  Bone density:  June 2014, osteoporosis  Lipid panel: "Borderline high", Dr. Milinda Antis   Allergies  Allergen Reactions  . Codeine     REACTION: hallucinations    Current Outpatient Prescriptions  Medication Sig Dispense Refill  . aspirin 81 MG chewable tablet Chew 81 mg by mouth daily.        Marland Kitchen BIOTIN PO Take by mouth.        . Calcium Carbonate-Vit D-Min (CALTRATE PLUS PO) Take 1 capsule by mouth daily.       . Cholecalciferol 2000 UNITS TABS Take 1 tablet by mouth daily.      Marland Kitchen  ciclopirox (PENLAC) 8 % solution Apply over nail and surrounding skin. Apply daily over previous coat. After seven (7) days, may remove with alcohol and continue cycle.  6.6 mL  11  . clonazePAM (KLONOPIN) 1 MG tablet Take 1 tablet (1 mg total) by mouth at bedtime as needed for anxiety.  30 tablet  2  . DiphenhydrAMINE HCl (BENADRYL PO) Take by mouth as needed.        . fish oil-omega-3 fatty acids 1000 MG capsule Take 2 g by mouth daily.       Marland Kitchen glucosamine-chondroitin 500-400 MG tablet Take 1 tablet by mouth 2 (two) times daily.       Marland Kitchen letrozole (FEMARA) 2.5 MG tablet Take 1 tablet (2.5 mg total) by mouth daily.  90 tablet  3  . levothyroxine (SYNTHROID, LEVOTHROID) 75 MCG tablet Take 75 mcg by mouth daily.        . Multiple Vitamin (MULTIVITAMIN) tablet Take 1 tablet by mouth daily.        Marland Kitchen tretinoin (RETIN-A) 0.025 % cream Apply topically at bedtime. To apply to face  45 g  3  . vitamin C (ASCORBIC ACID) 500 MG tablet Take 500 mg by mouth daily.       No current facility-administered medications for this visit.    OBJECTIVE: Middle-aged white woman in no acute distress Filed Vitals:   08/23/13 1535  BP: 117/70  Pulse: 66  Temp: 98.1 F (36.7 C)  Resp: 18     Body mass index is 21.46 kg/(m^2).    ECOG FS: 0 Filed Weights   08/23/13 1535  Weight: 124 lb (56.246 kg)   Sclerae unicteric, pupils equal  and round Oropharynx clear and moist-- no thrush or other lesions No cervical or supraclavicular adenopathy Lungs no rales or rhonchi Heart regular rate and rhythm Abd soft, nontender, positive bowel sounds MSK no focal spinal tenderness, no upper extremity lymphedema Neuro: nonfocal, well oriented, pleasant affect Breasts: The right breast is status post lumpectomy and radiation. There is no evidence of local recurrence. The right axilla is benign. The left breast is unremarkable Skin: I again low density areas of "masses" on her legs. They are clearly fatty tissue, giving the impression of a flat roundish mass because of irregular developments. There is no erythema, no tenderness, no hard or fluctuant mass.   LAB RESULTS: Lab Results  Component Value Date   WBC 3.9 08/20/2013   NEUTROABS 2.4 08/20/2013   HGB 13.4 08/20/2013   HCT 39.8 08/20/2013   MCV 94.0 08/20/2013   PLT 273 08/20/2013      Chemistry      Component Value Date/Time   NA 140 08/20/2013 1614   NA 141 09/11/2012 1303   K 4.6 08/20/2013 1614   K 4.8 09/11/2012 1303   CL 102 09/11/2012 1303   CL 99 08/24/2012 1500   CO2 26 08/20/2013 1614   CO2 29 09/11/2012 1303   BUN 11.1 08/20/2013 1614   BUN 9 09/11/2012 1303   CREATININE 0.8 08/20/2013 1614   CREATININE 0.7 09/11/2012 1303      Component Value Date/Time   CALCIUM 10.3 08/20/2013 1614   CALCIUM 9.6 09/11/2012 1303   ALKPHOS 63 08/20/2013 1614   ALKPHOS 43 09/11/2012 1303   AST 25 08/20/2013 1614   AST 23 09/11/2012 1303   ALT 17 08/20/2013 1614   ALT 19 09/11/2012 1303   BILITOT 0.35 08/20/2013 1614   BILITOT 0.5 09/11/2012 1303  Lab Results  Component Value Date   LABCA2 31 08/24/2012    STUDIES:  Most recent bone density in June 2014 at [facility] showed osteoporosis with a T score of -2.7 in the spine. A list 08/14/2013 showed no evidence of malignancy. The breast composition is category A.    ASSESSMENT: 67 y.o. [place] woman   (1)  status  post right lumpectomy and sentinel lymph node biopsy in February 15, 2009 for a T1c N0, grade 1 invasive ductal carcinoma.  Tumor was ER/PR positive, HER-2/neu negative with a low MIB-1.    (2)  Status post radiation completed in October 2010.    (3)  Briefly on tamoxifen and anastrozole, both with poor tolerance.  On letrozole since July 2011 and continuing with very good tolerance.  The plan is to continue antiestrogen therapy for a total of 5 years, until October 2015.   (4)  Osteoporosis, last bone density in June 2014 at [facility]:  As far as her breast cancer is concerned, Alyssa Huffman is doing "fine". She is going to continue the letrozole until her next visit here, July 2015, at which point she will "graduate".  I think the changes in her legs that bother her are really just fatty deposits. It could be that as she is exercising more the remaining fatty areas appear more geographic. I suggested that she discuss this at her gym with one of a trainer's and see if there are some leg exercises she can do to "shake" her legs in a way that would be pleasing to her period from a health point of view however this does not seem to me to be of concern.  Steward Drone has a good understanding of the overall plan, and agrees with it. She understands the goal of her treatment remains cure. She will call with any problems that may develop before her next visit here.  Fiorella Hanahan C    08/23/2013

## 2013-08-23 NOTE — Telephone Encounter (Signed)
, °

## 2013-08-24 DIAGNOSIS — C50411 Malignant neoplasm of upper-outer quadrant of right female breast: Secondary | ICD-10-CM | POA: Insufficient documentation

## 2013-08-29 ENCOUNTER — Ambulatory Visit: Payer: Medicare Other | Admitting: Oncology

## 2013-09-03 ENCOUNTER — Other Ambulatory Visit: Payer: Medicare Other

## 2013-09-10 ENCOUNTER — Telehealth: Payer: Self-pay | Admitting: Family Medicine

## 2013-09-10 DIAGNOSIS — M899 Disorder of bone, unspecified: Secondary | ICD-10-CM

## 2013-09-10 DIAGNOSIS — E785 Hyperlipidemia, unspecified: Secondary | ICD-10-CM

## 2013-09-10 DIAGNOSIS — E039 Hypothyroidism, unspecified: Secondary | ICD-10-CM

## 2013-09-10 DIAGNOSIS — Z0000 Encounter for general adult medical examination without abnormal findings: Secondary | ICD-10-CM | POA: Insufficient documentation

## 2013-09-10 NOTE — Telephone Encounter (Signed)
Message copied by Judy Pimple on Mon Sep 10, 2013  7:04 PM ------      Message from: Alvina Chou      Created: Wed Sep 05, 2013 10:42 AM      Regarding: Lab orders for Tuesday, 12.30.14       Patient is scheduled for CPX labs, please order future labs, Thanks , Terri       ------

## 2013-09-11 ENCOUNTER — Other Ambulatory Visit (INDEPENDENT_AMBULATORY_CARE_PROVIDER_SITE_OTHER): Payer: Medicare Other

## 2013-09-11 DIAGNOSIS — M899 Disorder of bone, unspecified: Secondary | ICD-10-CM

## 2013-09-11 DIAGNOSIS — E785 Hyperlipidemia, unspecified: Secondary | ICD-10-CM

## 2013-09-11 LAB — LDL CHOLESTEROL, DIRECT: Direct LDL: 134.1 mg/dL

## 2013-09-11 LAB — LIPID PANEL: Cholesterol: 228 mg/dL — ABNORMAL HIGH (ref 0–200)

## 2013-09-12 ENCOUNTER — Encounter: Payer: Medicare Other | Admitting: Family Medicine

## 2013-09-18 ENCOUNTER — Telehealth: Payer: Self-pay | Admitting: *Deleted

## 2013-09-18 NOTE — Telephone Encounter (Signed)
Message left 09/17/2012 by pt requesting a return call " regarding a medical question ".  Call returned this am and obtained identified VM- message left to return call to this RN and if more urgent need to request to speak to triage RN.

## 2013-09-18 NOTE — Telephone Encounter (Signed)
Pt called to this RN to inquire if MD would prescribe her retinol cream she has used for years.  This RN informed pt above not a usual medication used by this office nor is it related to her history of breast cancer.  Alyssa Huffman states medication has increased in cost from $30 to $160 and when she spoke with the pharmacist " he said that if my oncologist ordered it due to concerns for cancer prevention that my insurance may pay for it " " I have used it for years and Dr Jana Hakim knows I use it - so could you ask him for me "   This note will be given to MD for review.  Return call number for pt as [phone].

## 2013-09-19 ENCOUNTER — Encounter: Payer: Self-pay | Admitting: Family Medicine

## 2013-09-19 ENCOUNTER — Ambulatory Visit (INDEPENDENT_AMBULATORY_CARE_PROVIDER_SITE_OTHER): Payer: Medicare Other | Admitting: Family Medicine

## 2013-09-19 VITALS — BP 122/72 | HR 69 | Temp 97.7°F | Ht 63.25 in | Wt 123.8 lb

## 2013-09-19 DIAGNOSIS — L708 Other acne: Secondary | ICD-10-CM

## 2013-09-19 DIAGNOSIS — Z0000 Encounter for general adult medical examination without abnormal findings: Secondary | ICD-10-CM

## 2013-09-19 DIAGNOSIS — E785 Hyperlipidemia, unspecified: Secondary | ICD-10-CM

## 2013-09-19 DIAGNOSIS — Z1211 Encounter for screening for malignant neoplasm of colon: Secondary | ICD-10-CM | POA: Insufficient documentation

## 2013-09-19 DIAGNOSIS — D179 Benign lipomatous neoplasm, unspecified: Secondary | ICD-10-CM | POA: Insufficient documentation

## 2013-09-19 DIAGNOSIS — M810 Age-related osteoporosis without current pathological fracture: Secondary | ICD-10-CM

## 2013-09-19 DIAGNOSIS — E039 Hypothyroidism, unspecified: Secondary | ICD-10-CM

## 2013-09-19 DIAGNOSIS — Z23 Encounter for immunization: Secondary | ICD-10-CM

## 2013-09-19 DIAGNOSIS — L709 Acne, unspecified: Secondary | ICD-10-CM | POA: Insufficient documentation

## 2013-09-19 MED ORDER — TRETINOIN 0.025 % EX CREA: TOPICAL_CREAM | Freq: Every day | CUTANEOUS | Status: DC

## 2013-09-19 NOTE — Progress Notes (Signed)
Pre-visit discussion using our clinic review tool. No additional management support is needed unless otherwise documented below in the visit note.  

## 2013-09-19 NOTE — Patient Instructions (Signed)
Flu vaccine today  Pneumonia vaccine today  We will refer you for colonoscopy at check out

## 2013-09-19 NOTE — Progress Notes (Signed)
Subjective:    Patient ID: Lamica Huffman, female    DOB: [birth year], 68 y.o.   MRN: [id number]  HPI I have personally reviewed the Medicare Annual Wellness questionnaire and have noted 1. The patient's medical and social history 2. Their use of alcohol, tobacco or illicit drugs 3. Their current medications and supplements 4. The patient's functional ability including ADL's, fall risks, home safety risks and hearing or visual             impairment. 5. Diet and physical activities 6. Evidence for depression or mood disorders  The patients weight, height, BMI have been recorded in the chart and visual acuity is per eye clinic.  I have made referrals, counseling and provided education to the patient based review of the above and I have provided the pt with a written personalized care plan for preventive services.  She gets spots on her face that do not heal   (retin A helps them peel)- also uses retin A for skin damage  Also has more acne than she used to - chin and cheeks - and her pimples do not heal easily  She thinks the healing issues are related to her prior chemo  She generally has a lot of little concerns about appearance/ skin and also thinks she has a lump on R thigh that came up after going to the gym (states she has "fatty legs") - and so stopped working out   See scanned forms.  Routine anticipatory guidance given to patient.  See health maintenance. Flu shot -wants to get vaccine today Shingles-vaccine 4/14  PNA- wants vaccine today  Tetanus shot 5/09  Colonoscopy 5/09 - needs a 5 year recall she thinks- she is due  Breast cancer screening- mammogram 12/14 ok- followed for breast cancer - also had a recent breast exam so she does not need one today On femara - and has regular f/u  Advance directive- does not have a living will - interested in doing that (packet given) Cognitive function addressed- see scanned forms- and if abnormal then additional documentation  follows. - no worries about memory    PMH and SH reviewed  Meds, vitals, and allergies reviewed.   ROS: See HPI.  Otherwise negative.    Osteoporosis dexa 6/14 D level is 78  Hypothyroidism - sees Dr Alyssa Huffman  Pt has no clinical changes No change in energy level/ hair or skin/ edema and no tremor Lab Results  Component Value Date   TSH 1.02 01/19/2008      Hyperlipidemia Lab Results  Component Value Date   CHOL 228* 09/11/2013   CHOL 239* 12/13/2012   CHOL 250* 09/11/2012   Lab Results  Component Value Date   HDL 71.30 09/11/2013   HDL 73.80 12/13/2012   HDL 66.40 09/11/2012   Lab Results  Component Value Date   LDLCALC 115* 01/19/2008   Lab Results  Component Value Date   TRIG 81.0 09/11/2013   TRIG 84.0 12/13/2012   TRIG 109.0 09/11/2012   Lab Results  Component Value Date   CHOLHDL 3 09/11/2013   CHOLHDL 3 12/13/2012   CHOLHDL 4 09/11/2012   Lab Results  Component Value Date   LDLDIRECT 134.1 09/11/2013   LDLDIRECT 146.5 12/13/2012   LDLDIRECT 157.8 09/11/2012   cholesterol is improved  She still eats beef once in while   High HDL     Chemistry      Component Value Date/Time   NA 140 08/20/2013 1614  NA 141 09/11/2012 1303   K 4.6 08/20/2013 1614   K 4.8 09/11/2012 1303   CL 102 09/11/2012 1303   CL 99 08/24/2012 1500   CO2 26 08/20/2013 1614   CO2 29 09/11/2012 1303   BUN 11.1 08/20/2013 1614   BUN 9 09/11/2012 1303   CREATININE 0.8 08/20/2013 1614   CREATININE 0.7 09/11/2012 1303      Component Value Date/Time   CALCIUM 10.3 08/20/2013 1614   CALCIUM 9.6 09/11/2012 1303   ALKPHOS 63 08/20/2013 1614   ALKPHOS 43 09/11/2012 1303   AST 25 08/20/2013 1614   AST 23 09/11/2012 1303   ALT 17 08/20/2013 1614   ALT 19 09/11/2012 1303   BILITOT 0.35 08/20/2013 1614   BILITOT 0.5 09/11/2012 1303      Lab Results  Component Value Date   WBC 3.9 08/20/2013   HGB 13.4 08/20/2013   HCT 39.8 08/20/2013   MCV 94.0 08/20/2013   PLT 273 08/20/2013    Patient Active  Problem List   Diagnosis Date Noted  . Acne 09/19/2013  . Lipoma 09/19/2013  . Colon cancer screening 09/19/2013  . Encounter for Medicare annual wellness exam 09/10/2013  . Breast cancer of upper-outer quadrant of right female breast 08/24/2013  . Osteoporosis, unspecified 05/18/2013  . Bunion, left foot 05/09/2013  . Cervical dysplasia   . Hyperlipidemia 07/28/2011  . Fungal nail infection 07/28/2011  . HIATAL HERNIA 03/02/2010  . CONSTIPATION 03/02/2010  . RECTAL BLEEDING 03/02/2010  . HEPATIC CYST 03/02/2010  . OSTEOPENIA 03/02/2010  . COLONIC POLYPS, HYPERPLASTIC, HX OF 03/02/2010  . LEG CRAMPS, NOCTURNAL 01/19/2008  . HYPOTHYROIDISM 01/17/2008  . IBS 01/17/2008  . MIGRAINES, HX OF 01/17/2008   Past Medical History  Diagnosis Date  . DUB (dysfunctional uterine bleeding)   . Adenomyosis   . 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 Personal history of malignant neoplasm of breast   . Malignant neoplasm of colon, unspecified site     hyperplastic  . Personal history of colonic polyps   . Other specified disorders of liver     Hepatic cyst  . Diaphragmatic hernia without mention of obstruction or gangrene   . Unspecified hypothyroidism   . Irritable bowel syndrome   . Cramp of limb   . Hx of migraines   . Disorder of bone and cartilage, unspecified   . Colon polyps   . Hypothyroid   . Cervical dysplasia   . Osteoporosis, unspecified 05/18/2013   Past Surgical History  Procedure Laterality Date  . Breast lumpectomy      right  . Breast enhancement surgery      bilateral  . Breast implant removal      bilateral  . Partial hysterectomy  1980    endometriosis  . Hemorroidectomy  1990's  . Tubal ligation    . Septoplasty    . Appendectomy  1965  . Skin biopsy    . Vaginal hysterectomy    . Gynecologic cryosurgery    . Colposcopy    . Augmentation mammaplasty     History  Substance Use Topics  . Smoking status: Never Smoker   .  Smokeless tobacco: Never Used  . Alcohol Use: No   Family History  Problem Relation Age of Onset  . Uterine cancer Mother   . Aneurysm Mother 32  . Cancer Mother     uterine CA  . Colon cancer Maternal Aunt   . Breast cancer  Maternal Aunt     Age 59's  . Heart disease Father   . Prostate cancer Father   . Cancer Father     prostate CA  . Colon polyps Brother   . Cancer Cousin     x 2 ? pancreatic  . Cancer Maternal Aunt     Brain tumor   Allergies  Allergen Reactions  . Codeine     REACTION: hallucinations   Current Outpatient Prescriptions on File Prior to Visit  Medication Sig Dispense Refill  . aspirin 81 MG chewable tablet Chew 81 mg by mouth daily.        Marland Kitchen BIOTIN PO Take by mouth.        . Calcium Carbonate-Vit D-Min (CALTRATE PLUS PO) Take 1 capsule by mouth daily.       . Cholecalciferol 2000 UNITS TABS Take 1 tablet by mouth daily.      . ciclopirox (PENLAC) 8 % solution Apply over nail and surrounding skin. Apply daily over previous coat. After seven (7) days, may remove with alcohol and continue cycle.  6.6 mL  11  . clonazePAM (KLONOPIN) 1 MG tablet Take 1 tablet (1 mg total) by mouth at bedtime as needed for anxiety.  30 tablet  2  . DiphenhydrAMINE HCl (BENADRYL PO) Take by mouth as needed.        . fish oil-omega-3 fatty acids 1000 MG capsule Take 2 g by mouth daily.       Marland Kitchen glucosamine-chondroitin 500-400 MG tablet Take 1 tablet by mouth 2 (two) times daily.       Marland Kitchen letrozole (FEMARA) 2.5 MG tablet Take 1 tablet (2.5 mg total) by mouth daily.  90 tablet  3  . levothyroxine (SYNTHROID, LEVOTHROID) 75 MCG tablet Take 75 mcg by mouth daily.        . Multiple Vitamin (MULTIVITAMIN) tablet Take 1 tablet by mouth daily.        . vitamin C (ASCORBIC ACID) 500 MG tablet Take 500 mg by mouth daily.       No current facility-administered medications on file prior to visit.     Review of Systems Review of Systems  Constitutional: Negative for fever, appetite  change, fatigue and unexpected weight change.  Eyes: Negative for pain and visual disturbance.  Respiratory: Negative for cough and shortness of breath.   Cardiovascular: Negative for cp or palpitations    Gastrointestinal: Negative for nausea, diarrhea and constipation.  Genitourinary: Negative for urgency and frequency.  Skin: Negative for pallor or rash pos for lump on thigh/ acne and brown spots on face  Neurological: Negative for weakness, light-headedness, numbness and headaches.  Hematological: Negative for adenopathy. Does not bruise/bleed easily.  Psychiatric/Behavioral: Negative for dysphoric mood. The patient is not nervous/anxious.         Objective:   Physical Exam  Constitutional: She appears well-developed and well-nourished. No distress.  HENT:  Head: Normocephalic and atraumatic.  Right Ear: External ear normal.  Left Ear: External ear normal.  Mouth/Throat: Oropharynx is clear and moist.  Eyes: Conjunctivae and EOM are normal. Pupils are equal, round, and reactive to light. No scleral icterus.  Neck: Normal range of motion. Neck supple. No JVD present. Carotid bruit is not present. No thyromegaly present.  Cardiovascular: Normal rate, regular rhythm, normal heart sounds and intact distal pulses.  Exam reveals no gallop.   Pulmonary/Chest: Effort normal and breath sounds normal. No respiratory distress. She has no wheezes. She exhibits no tenderness.  Abdominal: Soft.  Bowel sounds are normal. She exhibits no distension, no abdominal bruit and no mass. There is no tenderness.  Musculoskeletal: Normal range of motion. She exhibits no edema and no tenderness.  Lymphadenopathy:    She has no cervical adenopathy.  Neurological: She is alert. She has normal reflexes. No cranial nerve deficit. She exhibits normal muscle tone. Coordination normal.  Skin: Skin is warm and dry. No rash noted. No erythema. No pallor.  Few areas of hyperpigmentation on face  Some comedones on  chin and cheeks Some skin tags in axillae   Vague soft lump on R upper thigh-resembles lipoma and nt   Psychiatric: She has a normal mood and affect.  Pt seems somewhat anxious and frustrated re: age changes , especially in appearance           Assessment & Plan:

## 2013-09-20 ENCOUNTER — Encounter: Payer: Self-pay | Admitting: Podiatrist

## 2013-09-20 ENCOUNTER — Ambulatory Visit (INDEPENDENT_AMBULATORY_CARE_PROVIDER_SITE_OTHER): Payer: Medicare Other | Admitting: Podiatrist

## 2013-09-20 ENCOUNTER — Ambulatory Visit (INDEPENDENT_AMBULATORY_CARE_PROVIDER_SITE_OTHER): Payer: Medicare Other

## 2013-09-20 ENCOUNTER — Telehealth: Payer: Self-pay | Admitting: *Deleted

## 2013-09-20 VITALS — BP 106/66 | HR 71 | Resp 24 | Ht 64.0 in | Wt 122.0 lb

## 2013-09-20 DIAGNOSIS — M21612 Bunion of left foot: Secondary | ICD-10-CM

## 2013-09-20 DIAGNOSIS — M21619 Bunion of unspecified foot: Secondary | ICD-10-CM

## 2013-09-20 NOTE — Patient Instructions (Signed)
Bunion You have a bunion deformity of the feet. This is more common in women. It tends to be an inherited problem. Symptoms can include pain, swelling, and deformity around the great toe. Numbness and tingling may also be present. Your symptoms are often worsened by wearing shoes that cause pressure on the bunion. Changing the type of shoes you wear helps reduce symptoms. A wide shoe decreases pressure on the bunion. An arch support may be used if you have flat feet. Avoid shoes with heels higher than two inches. This puts more pressure on the bunion. X-rays may be helpful in evaluating the severity of the problem. Other foot problems often seen with bunions include corns, calluses, and hammer toes. If the deformity or pain is severe, surgical treatment may be necessary. Keep off your painful foot as much as possible until the pain is relieved. Call your caregiver if your symptoms are worse.  SEEK IMMEDIATE MEDICAL CARE IF:  You have increased redness, pain, swelling, or other symptoms of infection. Document Released: 08/30/2005 Document Revised: 11/22/2011 Document Reviewed: 02/27/2007 Exit[employer] Patient Information 2014 [employer].  

## 2013-09-20 NOTE — Telephone Encounter (Signed)
Received prior auth request for Retin-A. Auth paperwork obtained, and placed in your inbox.

## 2013-09-20 NOTE — Assessment & Plan Note (Signed)
Reviewed health habits including diet and exercise and skin cancer prevention Reviewed appropriate screening tests for age  Also reviewed health mt list, fam hx and immunization status , as well as social and family history   See HPI Lab rev imms updated today  Enc exercise

## 2013-09-20 NOTE — Assessment & Plan Note (Signed)
Watched by oncology and endocrine  On femara dexa this summer Disc need for calcium/ vitamin D/ wt bearing exercise and bone density test every 2 y to monitor Disc safety/ fracture risk in detail   No falls or fx

## 2013-09-20 NOTE — Assessment & Plan Note (Signed)
On leg - area of fatty prominence  Pt noticed after working out - and thinks that was causative-I reassured not  She is upset about it from a cosmetic standpoint -no pain  Offered surgical consult to eval further at any time if she wants to  Enc her to keep exercising

## 2013-09-20 NOTE — Assessment & Plan Note (Signed)
Followed by endocrinology Per pt stable labs and no clinical changes

## 2013-09-20 NOTE — Assessment & Plan Note (Signed)
Followed by oncology- she is on femara  Disc need for calcium/ vitamin D/ wt bearing exercise and bone density test every 2 y to monitor Disc safety/ fracture risk in detail   No falls or fx  dexa 6/14

## 2013-09-20 NOTE — Assessment & Plan Note (Signed)
Retin A px -which works well for her - to use on affected areas Reminded that sun protection is important

## 2013-09-20 NOTE — Progress Notes (Signed)
   Subjective:    Patient ID: Alyssa Huffman, female    DOB: Dec 30, 1945, 68 y.o.   MRN: [id number]  "I have a bunion on my left foot.  This toe bothers me sometime."  Foot Pain This is a new (Bunion Left Painful, 4th digit left ) problem. Episode onset: 2 years. The problem occurs daily. The problem has been gradually worsening. The symptoms are aggravated by standing and walking (shoes). Treatments tried: bunion sling, toe spacer. The treatment provided no relief.   she presents today for pain on the left foot at the bunion prominence. She also has a painful fourth digit on the left foot which is also uncomfortable as well. The patient states her pain has gradually gotten worse in and shoe gear is pressing on the foot and causing discomfort. She's been using a bunion night splint and states that it has provided no relief.    Review of Systems  HENT: Positive for tinnitus.   Eyes: Positive for redness and itching.  Musculoskeletal: Positive for gait problem.  All other systems reviewed and are negative.       Objective:   Physical Exam GENERAL APPEARANCE: Alert, conversant. Appropriately groomed. No acute distress.  VASCULAR: Pedal pulses palpable and strong bilateral.  Capillary refill time is immediate to all digits,  Proximal to distal cooling it warm to warm.  Digital hair growth is present bilateral  NEUROLOGIC: sensation is intact epicritically and protectively to 5.07 monofilament at 5/5 sites bilateral.  Light touch is intact bilateral, vibratory sensation intact bilateral, achilles tendon reflex is intact bilateral.  MUSCULOSKELETAL: Mild bunion deformity is present left. Lateral deviation of the hallux is also present. Adductovarus rotation of the left fourth and fifth digits is seen some swelling of the left fourth digits is also present. Right foot also has a very mild bunion deformity. Hallux however is in a rectus alignment and position. Adductovarus rotation of the right  fourth and fifth digits is noted with no swelling present. DERMATOLOGIC: skin color, texture, and turger are within normal limits.  No preulcerative lesions are seen, no interdigital maceration noted.  No open lesions present.  Digital nails are asymptomatic.      Assessment & Plan:  Assessment: Mild bunion deformity left, inflammation of fourth digit left  Plan: Discussed treatment options and alternatives. Discussed shoe gear changes and conservative therapies which the patient feels she has already exhausted. Discussed surgical therapies including a silver bunion procedure with capsular tendon balancing procedure in order to put the hallux in a more rectus position. Also discussed the possibility of surgery to the left fourth toe however today it just looks inflamed it would most likely only needed an injection of some steroid to get it to calm down. Consent Forms were just and all questions were answered to the best of my ability.  the patient will consider her options. She will call if she would like to consider surgery.

## 2013-09-20 NOTE — Assessment & Plan Note (Signed)
Improved some from last year Goal is LDL under 130  Disc goals for lipids and reasons to control them Rev labs with pt Rev low sat fat diet in detail

## 2013-09-20 NOTE — Assessment & Plan Note (Signed)
Ref for screen colonosc  Pt has had breast cancer

## 2013-09-21 NOTE — Telephone Encounter (Signed)
Done - in out box to go to [employer]

## 2013-09-24 NOTE — Telephone Encounter (Signed)
Prior auth paperwork faxed, pending response from insurance company.   

## 2013-09-26 ENCOUNTER — Other Ambulatory Visit: Payer: Self-pay | Admitting: Family Medicine

## 2013-09-27 ENCOUNTER — Telehealth: Payer: Self-pay | Admitting: Internal Medicine

## 2013-09-27 NOTE — Telephone Encounter (Signed)
I have reviewed her record but I still don't see any indication for doing colonoscopy before 5 years. The "polyp" on last colonoscopy was a mucosal prolapse tissue and there are no recommendations for early recall colonoscopy due to breast cancer. If she is having any GI symptoms I would not hesitate to do re call colonoscopy earlier.

## 2013-09-27 NOTE — Telephone Encounter (Signed)
Pt was given a paper Rx for this medication at last CPE on 09/19/13, called pt to see if she still had paper Rx since we received an electronic refill request but pt didn't answer, so left voicemail requesting pt to call office

## 2013-09-28 NOTE — Telephone Encounter (Signed)
Spoke with Jinny Sanders and gave her Dr. Nichola Sizer recommendation. Sent to Dr. Glori Bickers also.

## 2013-09-28 NOTE — Telephone Encounter (Signed)
Pt does have the paper Rx that Dr. Glori Bickers gave her I declined this request and let pharmacy know pt will drop off paper Rx when she is ready to get it filled

## 2013-09-28 NOTE — Telephone Encounter (Signed)
Pt left voicemail on my phone wanting me to call her and let her know why we were calling and could leave VM if needed  Called pt and no answer so left voicemail letting pt know we received electronic refill request for med and need to know if she still has the paper Rx or does she need [place] to refill it electronically

## 2013-10-03 NOTE — Telephone Encounter (Signed)
Prior auth notification has still not been received from [employer]. I will resubmit paperwork.

## 2013-10-04 NOTE — Telephone Encounter (Signed)
Received notification from insurance that the request I submitted yesterday was cancelled because this med had already been approved. I will send paperwork to be signed then scanned into pt's medical record.

## 2013-10-09 ENCOUNTER — Other Ambulatory Visit: Payer: Self-pay | Admitting: Endocrinology

## 2013-10-09 DIAGNOSIS — E041 Nontoxic single thyroid nodule: Secondary | ICD-10-CM

## 2013-10-23 ENCOUNTER — Telehealth: Payer: Self-pay

## 2013-10-23 NOTE — Telephone Encounter (Signed)
Pt left v/m requesting prescription for Dermal Nail Liquid to [facility] for weakness of pts nails. Pt request cb when done.

## 2013-10-23 NOTE — Telephone Encounter (Signed)
I cannot find out what dermal nail is ? - can she or a pharmacist tell me and also give me directions for use? Thanks

## 2013-10-23 NOTE — Telephone Encounter (Signed)
I found the product with the correct name - and px is in IN box  It appears to be otc- she may not need the px

## 2013-10-23 NOTE — Telephone Encounter (Signed)
Called pt and she spelled it for me it's DermaNail pt said she just applied it to her cuticles and nails every night and it's the only thing that has helped her nails

## 2013-10-24 NOTE — Telephone Encounter (Signed)
Rx faxed pt pharmacy and pt notified

## 2013-11-13 ENCOUNTER — Telehealth: Payer: Self-pay | Admitting: *Deleted

## 2013-11-13 NOTE — Telephone Encounter (Addendum)
Pt states would like to schedule surgery with DR Valentina Lucks and the recovery time for bunion surgery..  I left a message to call to schedule her surgery with Wednesday dates in mind.  I left a message informing pt full recovery of a bunion surgery could take up to 6 - 9 months, but depending on the surgery the pt would usually be able to walk in a long leg surgery boot immediately after surgery, then in most cases gradually transfer in to a surgical shoe at about 4 - 6 weeks and then later transfer in to an athletic shoe a little later depending on the pt.  I encouraged pt to call with questions.

## 2013-11-16 ENCOUNTER — Telehealth: Payer: Self-pay | Admitting: Family Medicine

## 2013-11-16 NOTE — Telephone Encounter (Signed)
I do not know a lot specifically about that physician - but my patients are generally very happy with everyone at [facility]

## 2013-11-16 NOTE — Telephone Encounter (Signed)
Left voicemail letting pt know Dr. Tower's comments  

## 2013-11-16 NOTE — Telephone Encounter (Signed)
Pt called and wanted to get your opinion on Dr. Rolley Sims at [facility] in [place]. She has pulled up the website and tried to find reviews but unable to see them. Pt request c/b. Thanks

## 2013-11-19 ENCOUNTER — Telehealth: Payer: Self-pay | Admitting: *Deleted

## 2013-11-19 NOTE — Telephone Encounter (Signed)
Pt request 11/28/2013 surgery for Left Silver Bunionectomy with Dr Valentina Lucks.  I called pt and confirmed the date.

## 2013-11-22 ENCOUNTER — Telehealth: Payer: Self-pay | Admitting: *Deleted

## 2013-11-22 NOTE — Telephone Encounter (Signed)
Pt asked if she needed a knee-scooter.  I explained that a minimal amount of weight-bearing helped with healing, no more than 5 minutes/hour the 1st post-op week, then increase by 5 minutes/hour every week post-op as advised by Dr Valentina Lucks.  Pt asked if she needed a raised commode, I told her no.  Pt asked if she needed to stop her Aspirin 81mg  before the surgery, I told her no if it was doctor prescribed.  Pt states no she put herself on the Aspirin.  I told her to stop it 3 days prior.  Pt asked when her surgery time was, I told her [place] would call her with the surgery time.  I called Caren Griffins at [facility] and requested she call the pt again with the time if she had called before.

## 2013-11-22 NOTE — Telephone Encounter (Signed)
My last note does not mention consent forms being signed for surgery.  Can you please confirm that consent forms are signed and procedure is verified.

## 2013-11-28 ENCOUNTER — Encounter: Payer: Self-pay | Admitting: Podiatrist

## 2013-11-28 DIAGNOSIS — M2010 Hallux valgus (acquired), unspecified foot: Secondary | ICD-10-CM

## 2013-11-28 DIAGNOSIS — M2040 Other hammer toe(s) (acquired), unspecified foot: Secondary | ICD-10-CM

## 2013-12-05 ENCOUNTER — Encounter: Payer: Medicare Other | Admitting: Podiatrist

## 2013-12-07 ENCOUNTER — Ambulatory Visit (INDEPENDENT_AMBULATORY_CARE_PROVIDER_SITE_OTHER): Payer: Medicare Other

## 2013-12-07 ENCOUNTER — Ambulatory Visit (INDEPENDENT_AMBULATORY_CARE_PROVIDER_SITE_OTHER): Payer: Medicare Other | Admitting: Podiatrist

## 2013-12-07 ENCOUNTER — Encounter: Payer: Self-pay | Admitting: Podiatrist

## 2013-12-07 DIAGNOSIS — Z9889 Other specified postprocedural states: Secondary | ICD-10-CM

## 2013-12-07 DIAGNOSIS — M21619 Bunion of unspecified foot: Secondary | ICD-10-CM

## 2013-12-07 DIAGNOSIS — M2040 Other hammer toe(s) (acquired), unspecified foot: Secondary | ICD-10-CM

## 2013-12-07 DIAGNOSIS — M21612 Bunion of left foot: Secondary | ICD-10-CM

## 2013-12-07 NOTE — Patient Instructions (Signed)
Keep your dressing intact on your foot for 1 more week.  You will be seen back in 1 week to remove the sutures of the 5th toe.  After those are out, you can get your foot wet and wear whatever shoe you find to be comfortable.  For now keep elevating your foot and icing your foot to keep the swelling down.

## 2013-12-07 NOTE — Progress Notes (Signed)
Subjective: Patient presents today1 week status post foot surgery of the left foot.  Surgery performed was a silver/mcbride bunionectomy and a derotational arthroplasty of the fifth digit all on the left foot. Date of surgery 11/28/13. Patient denies nausea, vomiting, fevers, chills or night sweats.  Denies calf pain or tenderness to the operative side. States she never had to take any pain medication overall she's doing well.  Objective:  Neurovascular status is intact with palpable pedal pulses DP and PT bilateral at 2+ out of 4. Neurological sensation is intact and unchanged as per prior to surgery. Excellent appearance of the postoperative foot is noted. Swelling at the first metatarsal head continues to be present which mimics the appearance of a bunion. X-rays do show shaving of the first metatarsal head and that the bunion has been reduced. Derotational arthroplasty of the fifth digit has also been performed an incision site is well coapted with suture line in place. Bruising to the distal digits is also noted. No sign of systemic or local infection is present.  Assessment: Status post Silver/McBride bunionectomy left foot, derotational arthroplasty fifth digit left foot  Plan:  Clinical and radiographical evaluation was performed. The patient's foot appears to be healing very nicely one week postoperative. Redressed the foot and a dry and sterile compressive dressing and instructed her to keep it dry for one more week. I did remove the suture and from the bunion site already today. She will be seen back in one week and we will remove the sutures from the fifth toe. At that time she may wear her normal shoes and she is able to get the foot wet. She may also return to activities as she can tolerate. I will then see her 2 weeks postoperatively for her one-month postop visit. She may ice and utilize a compressive anklet as well.

## 2013-12-14 ENCOUNTER — Other Ambulatory Visit: Payer: Medicare Other

## 2013-12-17 ENCOUNTER — Ambulatory Visit: Payer: Medicare Other | Admitting: *Deleted

## 2013-12-17 VITALS — BP 132/64 | HR 86 | Resp 12

## 2013-12-17 DIAGNOSIS — Z9889 Other specified postprocedural states: Secondary | ICD-10-CM

## 2013-12-17 NOTE — Progress Notes (Signed)
   Subjective:    Patient ID: Alyssa Huffman, female    DOB: [birth year], 68 y.o.   MRN: [id number]  HPI TOOK THE STITCHES OUT TODAY AND THE FOOT IS DOING OK.   Review of Systems     Objective:   Physical Exam        Assessment & Plan:

## 2013-12-19 NOTE — Progress Notes (Signed)
Dr Valentina Lucks ordered Percocet 5/325mg  #40 1 or 2 every 4 to 6 hours prn pain, and Phenergan 25mg  #30 1 tablet every 6 hours prn nausea and vomiting.

## 2013-12-21 ENCOUNTER — Encounter: Payer: Medicare Other | Admitting: Podiatrist

## 2013-12-24 ENCOUNTER — Telehealth: Payer: Self-pay | Admitting: *Deleted

## 2013-12-24 NOTE — Telephone Encounter (Signed)
I'd like you to check my paperwork about the operation I had. What exercises can I do pertaining to the surgery I had, not what someone else may have had?  Can I do the treadmill?

## 2013-12-25 NOTE — Telephone Encounter (Signed)
I called and informed her she can do whatever exercises are comfortable.  Told her to continue wearing athletic style shoe.  She said she hasn't been wearing an athletic style shoe because her little toe still bothers her when she tries.  She said she's still wearing the surgical shoe.  I informed her the sneaker will provide more stability when doing exercise.  She stated maybe she'll hold off on the exercises until her toe is not as tender.

## 2013-12-25 NOTE — Telephone Encounter (Signed)
She can go ahead and do whatever activities she finds comfortable- including the treadmill.  (she had a shave bunion and a hammertoe)-- she still needs to wear a wider athletic shoe but she can resume whatever activities don't hurt her foot.

## 2013-12-26 IMAGING — US US SOFT TISSUE HEAD/NECK
1 series · 14 of 25 positions shown · non-contrast
Comparison: None.

CLINICAL DATA: Goiter.

THYROID ULTRASOUND
TECHNIQUE: Ultrasound examination of the thyroid gland and adjacent
soft tissues was performed.

[Series 1: us soft tissue head/neck · 0.04mm/px · 14 of 49 slices shown]
[im 1/49]
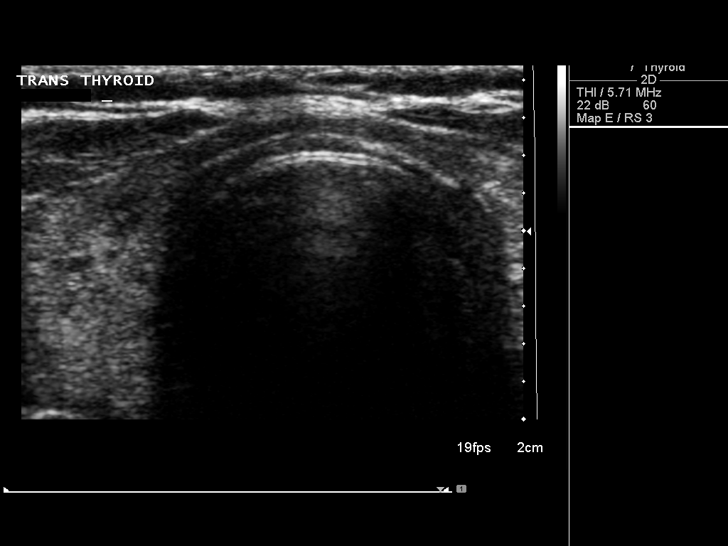
[im 5/49]
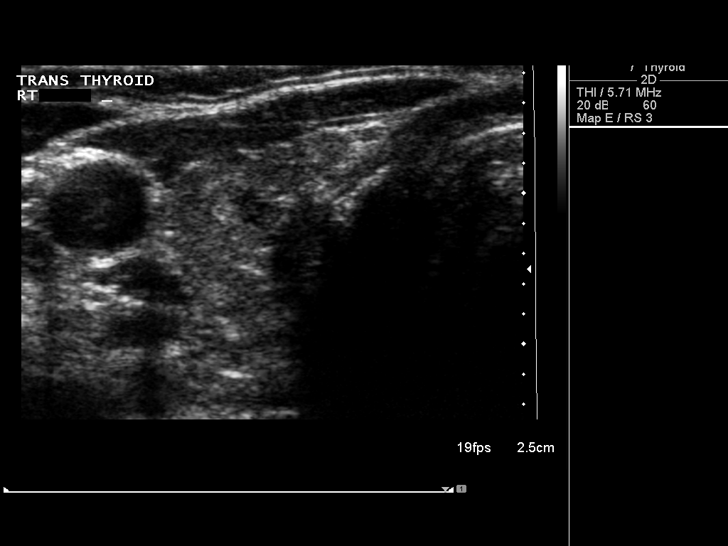
[im 9/49]
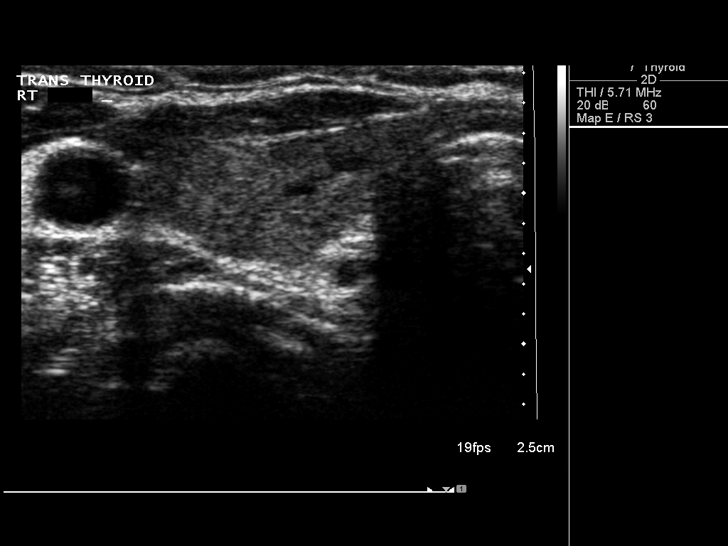
[im 13/49]
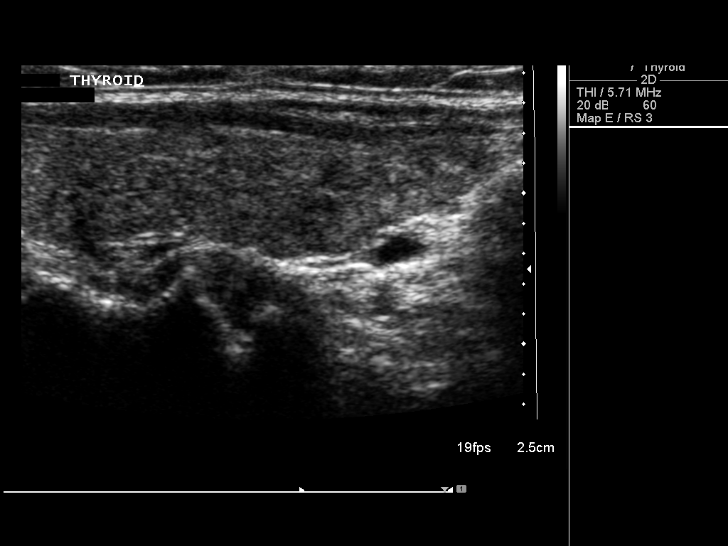
[im 17/49]
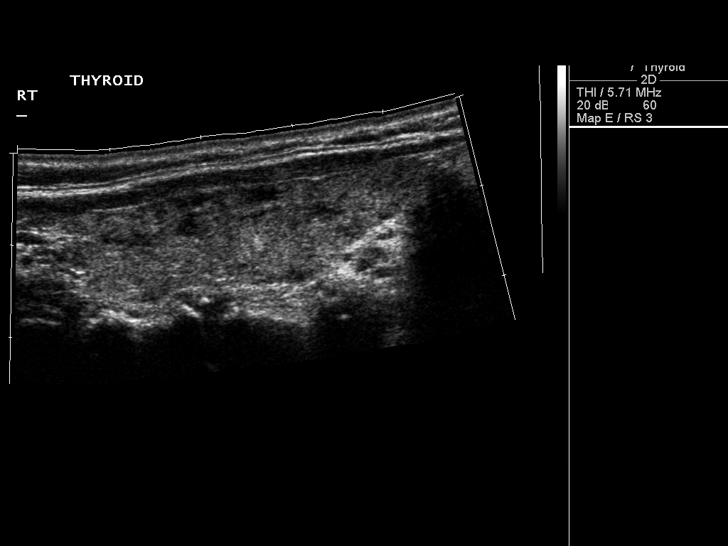
[im 19/49]
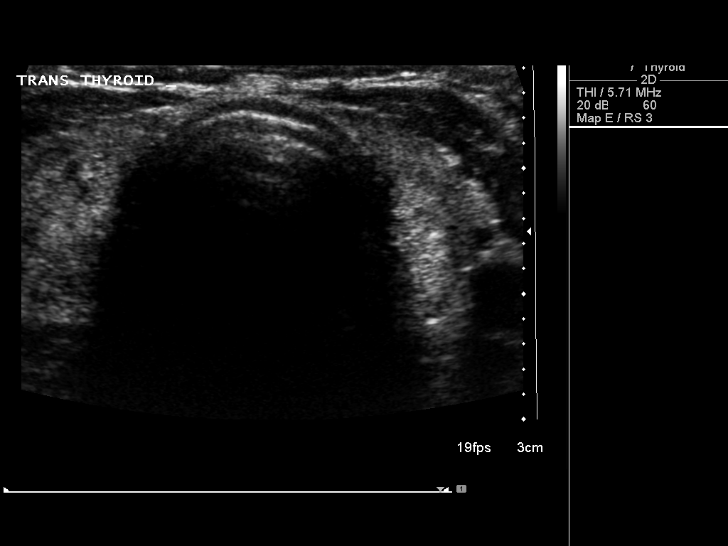
[im 23/49]
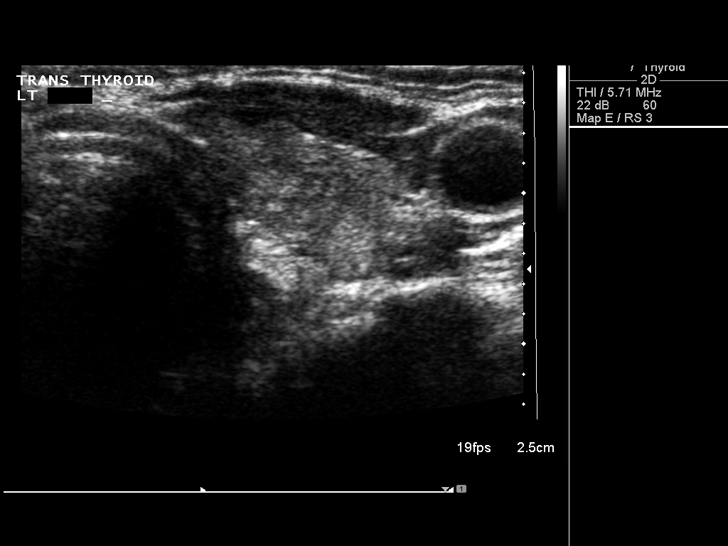
[im 27/49]
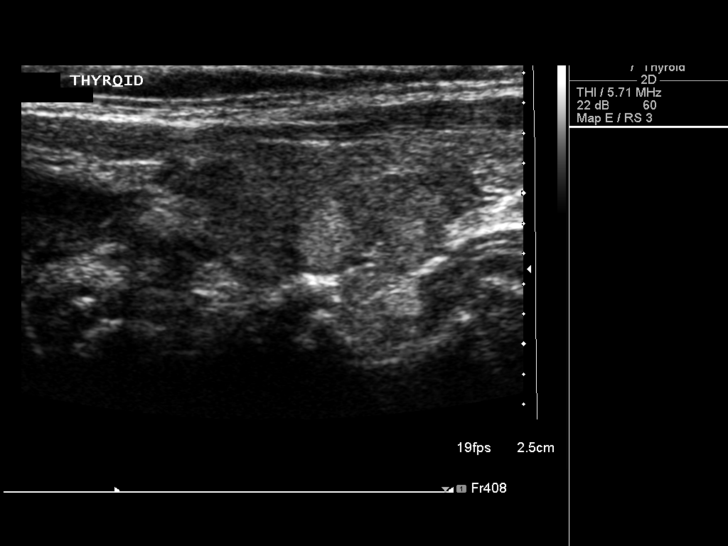
[im 31/49]
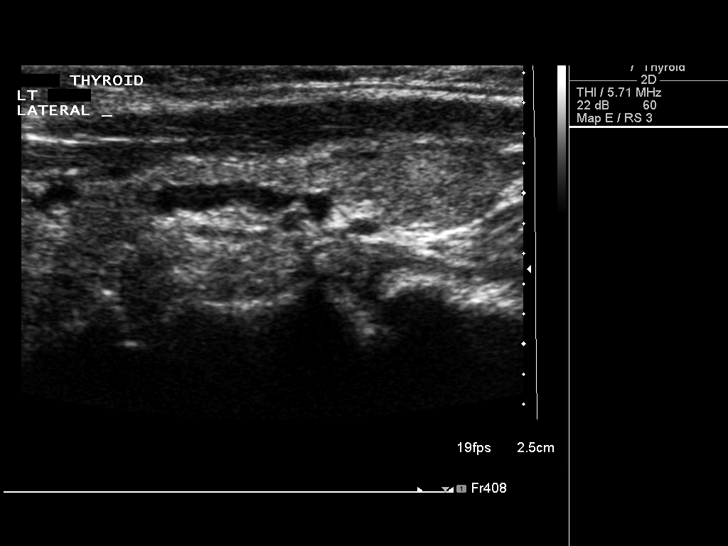
[im 33/49]
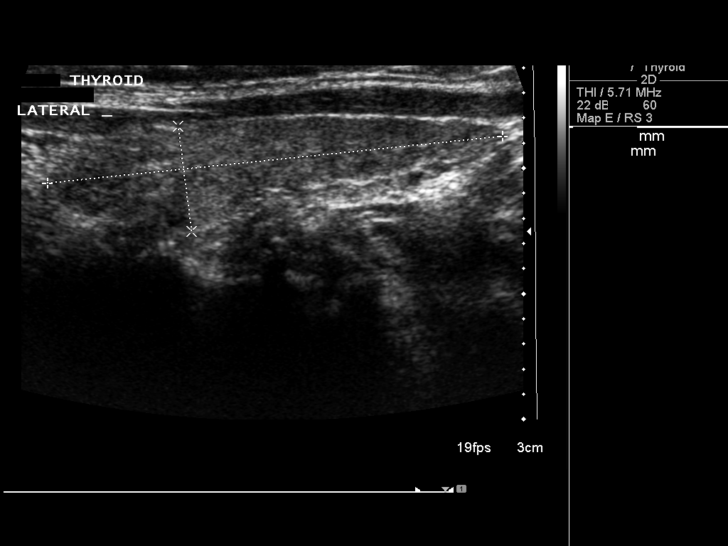
[im 37/49]
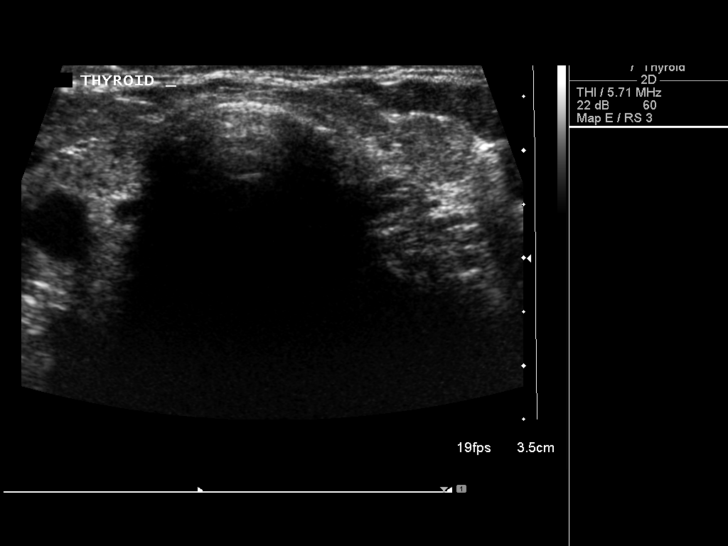
[im 41/49]
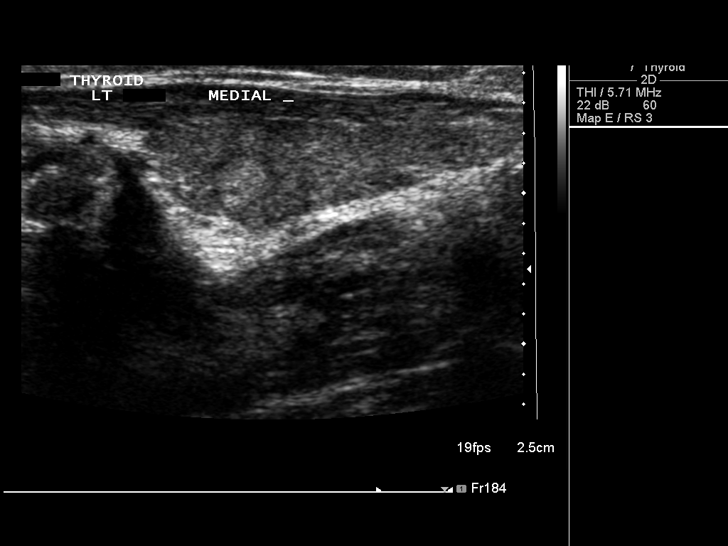
[im 45/49]
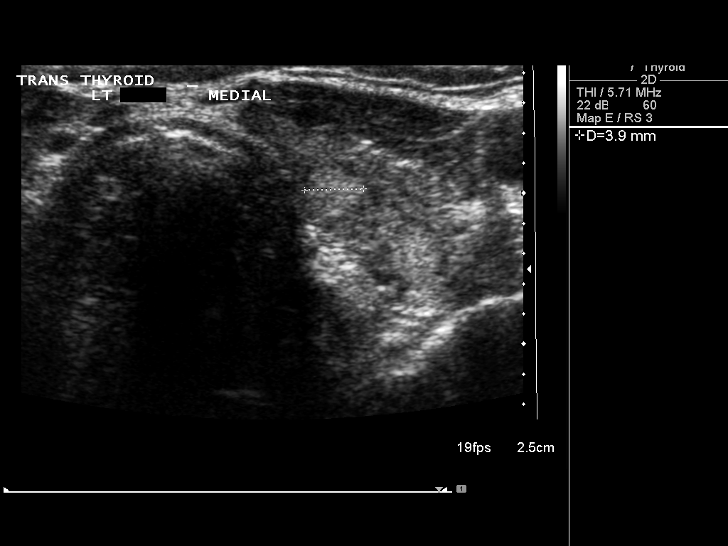
[im 49/49]
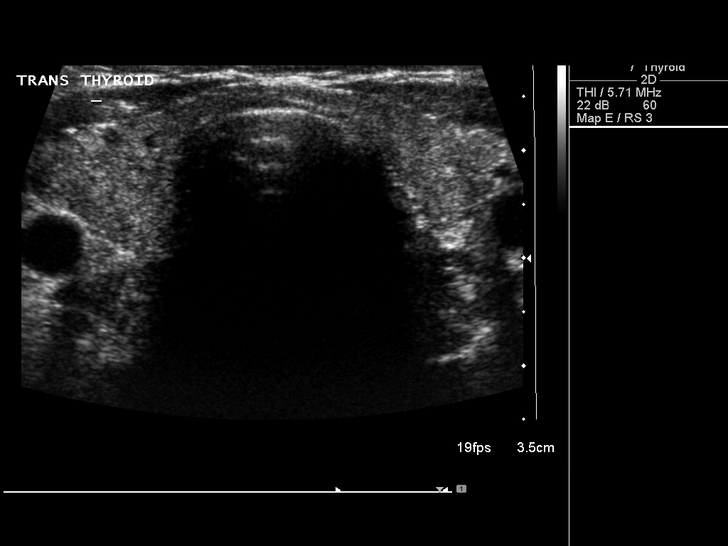

[14 of 25 positions shown; findings below may reference images not displayed]

FINDINGS: Right thyroid lobe:  4.5 x 1.2 x 1.6 cm.
Left thyroid lobe:  3.6 x 0.8 x 1.1 cm.
Isthmus:  0.14 cm.

Focal nodules:  The gland is diffusely heterogeneous.  There are
two small hyperechoic nodules in the left lobe, one in the
midportion and one in the lower pole, each measuring approximately
4 mm in size.

Lymphadenopathy:  None visualized.
IMPRESSION: Two small hypoechoic nodules in the left lobe measuring 4 mm each.
Otherwise normal ultrasound of the thyroid gland.

## 2014-01-02 ENCOUNTER — Encounter: Payer: Medicare Other | Admitting: Podiatrist

## 2014-01-16 ENCOUNTER — Encounter: Payer: Self-pay | Admitting: Podiatrist

## 2014-01-16 ENCOUNTER — Ambulatory Visit (INDEPENDENT_AMBULATORY_CARE_PROVIDER_SITE_OTHER): Payer: Medicare Other | Admitting: Podiatrist

## 2014-01-16 ENCOUNTER — Ambulatory Visit (INDEPENDENT_AMBULATORY_CARE_PROVIDER_SITE_OTHER): Payer: Medicare Other

## 2014-01-16 VITALS — BP 125/80 | HR 72 | Resp 18

## 2014-01-16 DIAGNOSIS — Z09 Encounter for follow-up examination after completed treatment for conditions other than malignant neoplasm: Secondary | ICD-10-CM

## 2014-01-16 DIAGNOSIS — Z9889 Other specified postprocedural states: Secondary | ICD-10-CM

## 2014-01-16 NOTE — Progress Notes (Signed)
I am doing good on my left foot and if she will look at the 5th toe on the left  Patient presents today for her first postop followup regarding a silver bunionectomy left and arthroplasty left fifth toe. She states overall she's doing well however the left fifth toe is swelling more than normal. Objective: Excellent appearance of the left foot is seen. Swelling at the first metatarsophalangeal joint and first metatarsal head is noted. Swelling at the fifth digit is also noted which is normal postoperatively. X-rays are normal and appear to be coming along nicely. Assessment: Left foot When: Recommended compression with a anklet. She has a very small foot and ankle and states she feels very minimal compression at all. This is the smallest anklet we have and therefore discussed if she's not uncomfortable she does not have to wear. Also recommended compression along the fifth digit and Coban wrap was dispensed she was discussed and showed her how to use it. She'll be seen back in another month for HER-2 month postoperative followup. If any problems arise prior that visit she is instructed to call.

## 2014-01-30 ENCOUNTER — Other Ambulatory Visit: Payer: Self-pay | Admitting: Oncology

## 2014-01-30 DIAGNOSIS — C50219 Malignant neoplasm of upper-inner quadrant of unspecified female breast: Secondary | ICD-10-CM

## 2014-02-05 ENCOUNTER — Telehealth: Payer: Self-pay | Admitting: *Deleted

## 2014-02-05 NOTE — Telephone Encounter (Signed)
Message copied by Lolita Rieger on Tue Feb 05, 2014 11:25 AM ------      Message from: Azell Der      Created: Tue Feb 05, 2014 10:24 AM      Regarding: CHANGE OF APPT/POV PAIN       Lakyra Tippins, PLEASE CALL PT. SHE HAS CALLED TWICE TO LEAVE MESSAGE. ------

## 2014-02-05 NOTE — Telephone Encounter (Signed)
My little toe that she did surgery on is still painful.  It has a knot on top of it.  I wrap it, it hurts worse.  Does she need to see me?  I'm going to be out of town for about 3-4 months.  Will be back and forth every 3 weeks.  I left her a message to call and schedule an appointment with Dr. Valentina Lucks.

## 2014-02-06 ENCOUNTER — Encounter: Payer: Self-pay | Admitting: Podiatrist

## 2014-02-06 ENCOUNTER — Ambulatory Visit (INDEPENDENT_AMBULATORY_CARE_PROVIDER_SITE_OTHER): Payer: Medicare Other

## 2014-02-06 ENCOUNTER — Ambulatory Visit (INDEPENDENT_AMBULATORY_CARE_PROVIDER_SITE_OTHER): Payer: Medicare Other | Admitting: Podiatrist

## 2014-02-06 VITALS — BP 114/68 | HR 73 | Resp 14 | Ht 64.0 in | Wt 120.6 lb

## 2014-02-06 DIAGNOSIS — Z09 Encounter for follow-up examination after completed treatment for conditions other than malignant neoplasm: Secondary | ICD-10-CM

## 2014-02-06 DIAGNOSIS — M21619 Bunion of unspecified foot: Secondary | ICD-10-CM

## 2014-02-11 NOTE — Progress Notes (Signed)
Patient presents today for her oost-op followup regarding a silver bunionectomy left and arthroplasty left fifth toe. She states overall she's doing well however the left fifth toe is still swelling more than normal.   Objective: Excellent appearance of the left foot is seen. Swelling at the first metatarsophalangeal joint and first metatarsal head is noted. Swelling at the fifth digit is also noted which is normal postoperatively. X-rays are normal and appear to be coming along nicely.   Assessment: Left foot silver bunionectomy and arthroplasty of the 5th digit DOS 11/28/2013  Plan: Recommended continued compression with a anklet. She has a very small foot and ankle and states she finds her elbow sleeve works the best-- she is dispensed surgigrip today anyway. Also recommended continued compression along the fifth digit and Coban wrap was dispensed she was again discussed and showed her how to use it. She'll be seen back in another month for her 3 month postoperative followup. If any problems arise prior that visit she is instructed to call. Also note she is getting ready to go out of town for a while and I will see her when she returns.

## 2014-02-20 ENCOUNTER — Encounter: Payer: Medicare Other | Admitting: Podiatrist

## 2014-03-07 ENCOUNTER — Telehealth: Payer: Self-pay | Admitting: Oncology

## 2014-03-07 NOTE — Telephone Encounter (Signed)
PT CALLED TO R/S 7/14 LAB APPT TO 7/16 IN THE AM (9AM) DUE TO TRAVEL PLANS AND CURRENTLY BEING OUT OF TOWN AND NOT ABLE TO COME IN PRIOR TO 7/14. PT WILL DO LAB 7/16 IN THE AM AND KEEP PM F/U APPT ON 7/16. PT HAS BOTH D/T'S

## 2014-03-11 ENCOUNTER — Other Ambulatory Visit: Payer: Self-pay | Admitting: Physician Assistant

## 2014-03-11 ENCOUNTER — Other Ambulatory Visit: Payer: Self-pay | Admitting: *Deleted

## 2014-03-11 DIAGNOSIS — C50419 Malignant neoplasm of upper-outer quadrant of unspecified female breast: Secondary | ICD-10-CM

## 2014-03-26 ENCOUNTER — Other Ambulatory Visit: Payer: Medicare Other

## 2014-03-27 ENCOUNTER — Other Ambulatory Visit: Payer: Self-pay | Admitting: *Deleted

## 2014-03-27 DIAGNOSIS — C50411 Malignant neoplasm of upper-outer quadrant of right female breast: Secondary | ICD-10-CM

## 2014-03-28 ENCOUNTER — Other Ambulatory Visit (HOSPITAL_BASED_OUTPATIENT_CLINIC_OR_DEPARTMENT_OTHER): Payer: 59

## 2014-03-28 ENCOUNTER — Ambulatory Visit (HOSPITAL_BASED_OUTPATIENT_CLINIC_OR_DEPARTMENT_OTHER): Payer: 59 | Admitting: Oncology

## 2014-03-28 DIAGNOSIS — G4700 Insomnia, unspecified: Secondary | ICD-10-CM

## 2014-03-28 DIAGNOSIS — Z170 Estrogen receptor positive status [ER+]: Secondary | ICD-10-CM

## 2014-03-28 DIAGNOSIS — C50419 Malignant neoplasm of upper-outer quadrant of unspecified female breast: Secondary | ICD-10-CM

## 2014-03-28 DIAGNOSIS — M810 Age-related osteoporosis without current pathological fracture: Secondary | ICD-10-CM

## 2014-03-28 DIAGNOSIS — C50411 Malignant neoplasm of upper-outer quadrant of right female breast: Secondary | ICD-10-CM

## 2014-03-28 DIAGNOSIS — N952 Postmenopausal atrophic vaginitis: Secondary | ICD-10-CM

## 2014-03-28 LAB — COMPREHENSIVE METABOLIC PANEL (CC13)
ALK PHOS: 63 U/L (ref 40–150)
ALT: 17 U/L (ref 0–55)
AST: 22 U/L (ref 5–34)
Albumin: 4.2 g/dL (ref 3.5–5.0)
Anion Gap: 8 mEq/L (ref 3–11)
BUN: 11.4 mg/dL (ref 7.0–26.0)
CO2: 28 mEq/L (ref 22–29)
CREATININE: 0.7 mg/dL (ref 0.6–1.1)
Calcium: 10 mg/dL (ref 8.4–10.4)
Chloride: 104 mEq/L (ref 98–109)
Glucose: 82 mg/dl (ref 70–140)
POTASSIUM: 5 meq/L (ref 3.5–5.1)
Sodium: 140 mEq/L (ref 136–145)
Total Bilirubin: 0.59 mg/dL (ref 0.20–1.20)
Total Protein: 7 g/dL (ref 6.4–8.3)

## 2014-03-28 LAB — CBC WITH DIFFERENTIAL/PLATELET
BASO%: 1 % (ref 0.0–2.0)
BASOS ABS: 0 10*3/uL (ref 0.0–0.1)
EOS%: 2.9 % (ref 0.0–7.0)
Eosinophils Absolute: 0.1 10*3/uL (ref 0.0–0.5)
HCT: 40.7 % (ref 34.8–46.6)
HEMOGLOBIN: 13.6 g/dL (ref 11.6–15.9)
LYMPH%: 25.4 % (ref 14.0–49.7)
MCH: 31.3 pg (ref 25.1–34.0)
MCHC: 33.3 g/dL (ref 31.5–36.0)
MCV: 94.2 fL (ref 79.5–101.0)
MONO#: 0.5 10*3/uL (ref 0.1–0.9)
MONO%: 12.7 % (ref 0.0–14.0)
NEUT#: 2.2 10*3/uL (ref 1.5–6.5)
NEUT%: 58 % (ref 38.4–76.8)
Platelets: 262 10*3/uL (ref 145–400)
RBC: 4.33 10*6/uL (ref 3.70–5.45)
RDW: 13.4 % (ref 11.2–14.5)
WBC: 3.9 10*3/uL (ref 3.9–10.3)
lymph#: 1 10*3/uL (ref 0.9–3.3)

## 2014-03-28 MED ORDER — LETROZOLE 2.5 MG PO TABS: ORAL_TABLET | ORAL | Status: DC

## 2014-03-28 MED ORDER — ALPRAZOLAM 0.25 MG PO TABS: 0.2500 mg | ORAL_TABLET | Freq: Every evening | ORAL | Status: DC | PRN

## 2014-03-28 NOTE — Progress Notes (Signed)
ID: Alyssa Huffman   DOB: [birth year]  MR#: [id number]  LKT#:[id number]   PCP:  Loura Pardon, MD GYN:  Dian Queen, MD SU:  Jackolyn Confer, MD Other:  Delfin Edis, MD   HISTORY OF PRESENT ILLNESS:  The patient had been carefully evaluated through the [facility] and in particular  December 10, 2008 she underwent mammography and ultrasonography for a right breast mass, which was 1.5 cm in the axillary portion of the right breast with spiculated borders.  It was echogenic measured 1.6 cm by ultrasound and biopsy was recommended and performed on December 18, 2008.  However, that biopsy ([id number]) showed only a benign breast parenchyma.  Dr. Isaiah Blakes remained concerned that the actual mass may have been missed and she suggested excisional biopsy.  A second ultrasound on April 21 was performed just to make sure that the correct mass was biopsied and indeed that seemed to be the case.  At any rate the patient underwent bilateral breast MRIs on February 11, 2009 and this confirmed the presence of a 1.7 cm area of enhancement deep in the right breast with no other suspicious areas.  There were two areas in the liver, which were felt likely to be cysts.  With this information the patient was referred to Dr. Zella Richer and after appropriate discussion she underwent right lumpectomy as well as excision of the left shoulder lipoma March 20, 2009.  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  Sentinel lymph node sampling was not done on that procedure since there had not been a prior pathologic diagnosis of breast cancer, but was ultimately performed July 20 with the pathology there ([id number]) showing 3 sentinel lymph nodes negative for tumor.  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   Her subsequent history is as detailed  below.  INTERVAL HISTORY: "Alyssa Huffman" returns today for followup of her right breast cancer accompanied by her husband. She continues on letrozole, which she is tolerating well, except for hot flashes and vaginal dryness. They recently returned from some business and pleasure travel. She will be completing 5 years of adjuvant anti-estrogens in October and is ready to "graduate" today.  REVIEW OF SYSTEMS: The hot flashes are new since, but the vaginal dryness is significant. Intercourse is very painful. This is significantly interfering with her intimacy. Aside from that she has dry eyes, irritable bowel symptoms, occasional soreness in the right breast, which is neither more intense nor persistent than usual, and some mild arthritis symptoms. She complains of insomnia. A detailed review of systems today was otherwise entirely noncontributory  PAST MEDICAL HISTORY: Past Medical History  Diagnosis Date  . DUB (dysfunctional uterine bleeding)   . Adenomyosis   . 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 Personal history of malignant neoplasm of breast   . Malignant neoplasm of colon, unspecified site     hyperplastic  . Personal history of colonic polyps   . Other specified disorders of liver     Hepatic cyst  . Diaphragmatic hernia without mention of obstruction or gangrene   . Unspecified hypothyroidism   . Irritable bowel syndrome   . Cramp of limb   . Hx of migraines   . Disorder of bone and cartilage, unspecified   . Colon polyps   . Hypothyroid   . Cervical  dysplasia   . Osteoporosis, unspecified 05/18/2013   osteopenia, early cataracts, hypothyroidism, history of a ruptured C5 disc, history of migraines, history of breast augmentation with later explantation (for possible rupture), status post hysterectomy with no salpingo-oophorectomy (the procedure was performed for endometriosis when the patient was 68 years old), history of appendectomy, history of skin  biopsy in the right lower extremity for a "nonmalignant" skin area.  In addition, a chest x-ray preop showed possible COPD in this nonsmoker.  PAST SURGICAL HISTORY: Past Surgical History  Procedure Laterality Date  . Breast lumpectomy      right  . Breast enhancement surgery      bilateral  . Breast implant removal      bilateral  . Partial hysterectomy  1980    endometriosis  . Hemorroidectomy  1990's  . Tubal ligation    . Septoplasty    . Appendectomy  1965  . Skin biopsy    . Vaginal hysterectomy    . Gynecologic cryosurgery    . Colposcopy    . Augmentation mammaplasty      FAMILY HISTORY Family History  Problem Relation Age of Onset  . Uterine cancer Mother   . Aneurysm Mother 79  . Cancer Mother     uterine CA  . Colon cancer Maternal Aunt   . Breast cancer Maternal Aunt     Age 59's  . Heart disease Father   . Prostate cancer Father   . Cancer Father     prostate CA  . Colon polyps Brother   . Cancer Cousin     x 2 ? pancreatic  . Cancer Maternal Aunt     Brain tumor  The patient's father is alive at age 70.  He is status post MI.  The patient's mother died at age 77.  She had a ruptured cerebral aneurysm and she had a history of uterine cancer, which she survived.  The patient has one sister and two brothers with no cancer.  The patient's mother had five sisters, one of whom had breast cancer diagnosed in her 65s.  There is no history of ovarian cancer in the family.  GYNECOLOGIC HISTORY: The patient is Gx, P2, first pregnancy to term age 20.  She had a hysterectomy age 29 as noted.  She took hormones for approximately 15 years, stopping at the time of this diagnosis.  SOCIAL HISTORY: She is a retired Therapist, sports, used to run a [employer]; husband Alyssa Huffman was Engineer, maintenance of a company working with IT, now is a Optometrist. Son Alyssa Huffman here in [place] is a Music therapist; son Paramedic in [place] also as a Facilities manager. Patient has a  41 year old grandson. She attends [employer]  ADVANCED DIRECTIVES: not in place  HEALTH MAINTENANCE: History  Substance Use Topics  . Smoking status: Never Smoker   . Smokeless tobacco: Never Used  . Alcohol Use: No     Colonoscopy:  PAP:  June 2014, normal  Bone density:  June 2014, osteoporosis  Lipid panel: "Borderline high", Dr. Glori Bickers   Allergies  Allergen Reactions  . Codeine     REACTION: hallucinations    Current Outpatient Prescriptions  Medication Sig Dispense Refill  . ALPRAZolam (XANAX) 0.25 MG tablet Take 1 tablet (0.25 mg total) by mouth at bedtime as needed for anxiety.  30 tablet  0  . aspirin 81 MG chewable tablet Chew 81 mg by mouth daily.        Marland Kitchen azithromycin (ZITHROMAX) 250  MG tablet       . BIOTIN PO Take by mouth.        . Calcium Carbonate-Vit D-Min (CALTRATE PLUS PO) Take 1 capsule by mouth daily.       . Cholecalciferol 2000 UNITS TABS Take 1 tablet by mouth daily.      . ciclopirox (PENLAC) 8 % solution Apply over nail and surrounding skin. Apply daily over previous coat. After seven (7) days, may remove with alcohol and continue cycle.  6.6 mL  11  . DiphenhydrAMINE HCl (BENADRYL PO) Take by mouth as needed.        . doxycycline (VIBRAMYCIN) 50 MG capsule       . fish oil-omega-3 fatty acids 1000 MG capsule Take 2 g by mouth daily.       Marland Kitchen glucosamine-chondroitin 500-400 MG tablet Take 1 tablet by mouth 2 (two) times daily.       Marland Kitchen letrozole (FEMARA) 2.5 MG tablet TAKE ONE TABLET BY MOUTH ONCE DAILY  90 tablet  0  . levothyroxine (SYNTHROID, LEVOTHROID) 75 MCG tablet Take 75 mcg by mouth daily.        . Multiple Vitamin (MULTIVITAMIN) tablet Take 1 tablet by mouth daily.        Marland Kitchen oxyCODONE-acetaminophen (PERCOCET/ROXICET) 5-325 MG per tablet       . promethazine (PHENERGAN) 25 MG tablet       . tretinoin (RETIN-A) 0.025 % cream Apply topically at bedtime. To apply to face for acne once daily  45 g  3  . vitamin C (ASCORBIC ACID) 500  MG tablet Take 500 mg by mouth daily.       No current facility-administered medications for this visit.    OBJECTIVE: Middle-aged white woman in no acute distress Sclerae unicteric, pupils round and equal Oropharynx clear and moist other lesions No cervical or supraclavicular adenopathy Lungs no rales or rhonchi Heart regular rate and rhythm Abd soft, nontender, positive bowel sounds MSK no focal spinal tenderness, no upper extremity lymphedema Neuro: nonfocal, well oriented, positive affect Breasts: The right breast is status post lumpectomy and radiation. There is no evidence of local recurrence. The right axilla is benign. The left breast is unremarkable   LAB RESULTS: Lab Results  Component Value Date   WBC 3.9 03/28/2014   NEUTROABS 2.2 03/28/2014   HGB 13.6 03/28/2014   HCT 40.7 03/28/2014   MCV 94.2 03/28/2014   PLT 262 03/28/2014      Chemistry      Component Value Date/Time   NA 140 03/28/2014 0924   NA 141 09/11/2012 1303   K 5.0 03/28/2014 0924   K 4.8 09/11/2012 1303   CL 102 09/11/2012 1303   CL 99 08/24/2012 1500   CO2 28 03/28/2014 0924   CO2 29 09/11/2012 1303   BUN 11.4 03/28/2014 0924   BUN 9 09/11/2012 1303   CREATININE 0.7 03/28/2014 0924   CREATININE 0.7 09/11/2012 1303      Component Value Date/Time   CALCIUM 10.0 03/28/2014 0924   CALCIUM 9.6 09/11/2012 1303   ALKPHOS 63 03/28/2014 0924   ALKPHOS 43 09/11/2012 1303   AST 22 03/28/2014 0924   AST 23 09/11/2012 1303   ALT 17 03/28/2014 0924   ALT 19 09/11/2012 1303   BILITOT 0.59 03/28/2014 0924   BILITOT 0.5 09/11/2012 1303       Lab Results  Component Value Date   LABCA2 31 08/24/2012    STUDIES: Mammography at [facility] 08/16/2013 was unremarkable  ASSESSMENT: 68 y.o. [place] woman   (1)  status post right lumpectomy and sentinel lymph node biopsy in February 15, 2009 for a T1c N0, stage IA invasive ductal carcinoma, grade 1, estrogen and progesterone receptor positive, HER-2/neu negative with  a low MIB-1.    (2)  adjuvant radiation completed in October 2010.    (3)  Briefly on tamoxifen and anastrozole, both with poor tolerance.  On letrozole since July 2011 and continuing with very good tolerance.  The plan is to continue antiestrogen therapy for a total of 5 years, until October 2015.   (4)  Osteoporosis noted on bone density in June 2014 at [facility]  (5) vaginal atrophy  (6) insomnia   PLAN:  We discussed her sleep problems and I suggested she always wake up at the same time. Regular exercise will also help. She has been using Benadryl and Klonopin as sleep aids but wanted to switch to Xanax. I explained that alprazolam can be more habit-forming than the other benzodiazepines, and that coming off it can be quite troublesome. I did write her a prescription for 30 tablets with no refills but suggested she take it no more than once or twice a week at the os.  I urged her to come to our "pelvic health" classes, which may help with the vaginal atrophy and dyspareunia. Unfortunately she will not be able to use vaginal estrogens given her history of breast cancer.  More importantly, she is ready to "graduate" today. She will continue the letrozole through October of this year, then stop. I am hopeful that we'll be a little bit of an improvement in her hot flashes and vaginal dryness, but these are common menopausal symptoms, as is the insomnia.  Alyssa Huffman knows I will be glad to see her at any point in the future as the need arises. However we're making no further routine appointment for her here.  Airik Goodlin C    03/28/2014

## 2014-03-29 ENCOUNTER — Telehealth: Payer: Self-pay | Admitting: Oncology

## 2014-03-29 ENCOUNTER — Encounter: Payer: Medicare Other | Admitting: Podiatrist

## 2014-03-29 NOTE — Telephone Encounter (Signed)
Sent letter to Dr. Loura Pardon office from Dr. Jana Hakim

## 2014-04-30 ENCOUNTER — Ambulatory Visit (INDEPENDENT_AMBULATORY_CARE_PROVIDER_SITE_OTHER): Payer: Medicare Other

## 2014-04-30 ENCOUNTER — Ambulatory Visit (INDEPENDENT_AMBULATORY_CARE_PROVIDER_SITE_OTHER): Payer: Medicare Other | Admitting: Podiatrist

## 2014-04-30 ENCOUNTER — Encounter: Payer: Self-pay | Admitting: Podiatrist

## 2014-04-30 VITALS — BP 136/77 | HR 58 | Resp 18

## 2014-04-30 DIAGNOSIS — M2040 Other hammer toe(s) (acquired), unspecified foot: Secondary | ICD-10-CM

## 2014-04-30 DIAGNOSIS — M2042 Other hammer toe(s) (acquired), left foot: Secondary | ICD-10-CM

## 2014-04-30 DIAGNOSIS — M21612 Bunion of left foot: Secondary | ICD-10-CM

## 2014-04-30 DIAGNOSIS — Z09 Encounter for follow-up examination after completed treatment for conditions other than malignant neoplasm: Secondary | ICD-10-CM

## 2014-04-30 DIAGNOSIS — M21619 Bunion of unspecified foot: Secondary | ICD-10-CM

## 2014-04-30 NOTE — Patient Instructions (Signed)
I am sending you to [facility] in [place], [place].  I will fax over a prescription and you will just need to call them to set up the appointment for treatments.      [address] [place],  [address] Phone [phone]  Fax [phone] [email or web address] Mon - Fri, 8am - 6pm

## 2014-05-06 NOTE — Progress Notes (Signed)
Patient presents today for her post-op followup regarding a silver bunionectomy left and arthroplasty left fifth toe. She states overall she's doing well however the left foot and  fifth toe is still swelling more than normal.  Objective: Excellent appearance of the left foot is seen. Swelling at the first metatarsophalangeal joint and first metatarsal head is noted. Swelling at the fifth digit is also noted. X-rays show residual swelling.   Assessment: Left foot silver bunionectomy and arthroplasty of the 5th digit DOS 11/28/2013   Plan: Recommended physical therapy to decrease the swelling and PT was written for a therapist in [place].  If the swelling does not subside after 6 weeks of therapy, she will call.

## 2014-07-15 ENCOUNTER — Encounter: Payer: Self-pay | Admitting: Podiatrist

## 2014-07-24 ENCOUNTER — Other Ambulatory Visit: Payer: Self-pay | Admitting: Obstetrics and Gynecology

## 2014-07-24 DIAGNOSIS — N6489 Other specified disorders of breast: Secondary | ICD-10-CM

## 2014-07-24 DIAGNOSIS — Z853 Personal history of malignant neoplasm of breast: Secondary | ICD-10-CM

## 2014-08-14 ENCOUNTER — Ambulatory Visit (INDEPENDENT_AMBULATORY_CARE_PROVIDER_SITE_OTHER): Payer: Medicare Other | Admitting: Podiatrist

## 2014-08-14 ENCOUNTER — Ambulatory Visit (INDEPENDENT_AMBULATORY_CARE_PROVIDER_SITE_OTHER): Payer: Medicare Other

## 2014-08-14 ENCOUNTER — Encounter: Payer: Self-pay | Admitting: Podiatrist

## 2014-08-14 VITALS — BP 125/69 | HR 70 | Resp 12

## 2014-08-14 DIAGNOSIS — M21612 Bunion of left foot: Secondary | ICD-10-CM

## 2014-08-14 DIAGNOSIS — M2012 Hallux valgus (acquired), left foot: Secondary | ICD-10-CM

## 2014-08-15 ENCOUNTER — Other Ambulatory Visit: Payer: Medicare Other

## 2014-08-18 NOTE — Progress Notes (Signed)
Chief Complaint  Patient presents with  . Toe Pain    ''LT FOOT 5TH TOE STILL PAINFUL AND PT IS NOT HELPING AT ALL.''     HPI: Patient is 68 y.o. female who presents today for continued swelling and pain on the left foot at the sites of her previous cheilectomy and fifth digit arthroplasty.  She states she is unhappy with the surgery as she feels it was not a success.  She was seen by a physical therapist in [place] where she lives now who told her the swelling area was a bone and she needed further surgery to fix it.  She also stated all he did was massage her feet.  She is here for follow up.  Her surgery was done over 9 months ago.    Allergies  Allergen Reactions  . Codeine     REACTION: hallucinations    Physical Exam  Neurovascular status unchanged.  Swelling at the site of surgeries is noted.  Medial swelling noted along the first metatarsal.  Swelling of the fifth digit is also present.  xrays taken and reveal continued swelling and soft tissue at the area of the correction.  It does appear the bones are straight, however due to the swelling, they clinically appear the same as they did prior to surgery.    Assessment:  Post operative swelling-- chronic  Plan: injected the areas with kenalog and marcaine mix to try to decrease the swelling in these areas.  Told her not to return to the physical therapist who did not do ultrasound or anything useful to help with the swelling as I had prescribed.  She will be seen back in 1 month.

## 2014-08-29 ENCOUNTER — Ambulatory Visit
Admission: RE | Admit: 2014-08-29 | Discharge: 2014-08-29 | Disposition: A | Discharge status: Home / Self Care | Payer: Medicare Other | Source: Ambulatory Visit | Attending: Obstetrics and Gynecology | Admitting: Obstetrics and Gynecology

## 2014-08-29 DIAGNOSIS — Z853 Personal history of malignant neoplasm of breast: Secondary | ICD-10-CM

## 2014-08-29 DIAGNOSIS — N6489 Other specified disorders of breast: Secondary | ICD-10-CM

## 2014-08-29 MED ORDER — GADOBENATE DIMEGLUMINE 529 MG/ML IV SOLN
11.0000 mL | Freq: Once | INTRAVENOUS | Status: AC | PRN
  Administered 2014-08-29: 11 mL via INTRAVENOUS

## 2014-09-10 ENCOUNTER — Telehealth: Payer: Self-pay | Admitting: Family Medicine

## 2014-09-10 NOTE — Telephone Encounter (Signed)
Pt needs to cancel her 09/20/14 Wellness Visit and request to reschedule on 10/16/14 @ 2 pm. Will this be ok or do I need to wait until May for your next available cpe?

## 2014-09-10 NOTE — Telephone Encounter (Signed)
Go ahead and schedule her for [facility]

## 2014-09-10 NOTE — Telephone Encounter (Signed)
Pt is req to change her CPE from 10/16/14 to 12/11/14 at 2pm. She states that she has a 5 hr drive and cannot make the Feb visit. There are no CPE slots open until May. Is this ok?

## 2014-09-10 NOTE — Telephone Encounter (Signed)
Pt scheduled 02/03/16at 2pm.  Thanks

## 2014-09-11 NOTE — Telephone Encounter (Signed)
There is a chance I may be going out of town that day but I do not know yet - Send this back to me as a reminder - and I will hopefully find out this weekend  Thanks

## 2014-09-12 NOTE — Telephone Encounter (Signed)
Reminder....Marland KitchenMarland KitchenThank you Dr Glori Bickers!

## 2014-09-17 NOTE — Telephone Encounter (Signed)
I will be out of town then - perhaps a different date ? - I will be out that week on CME

## 2014-09-20 ENCOUNTER — Encounter: Payer: 59 | Admitting: Family Medicine

## 2014-10-02 ENCOUNTER — Other Ambulatory Visit: Payer: 59

## 2014-10-03 NOTE — Telephone Encounter (Signed)
Pt scheduled for 3/22 for CPE

## 2014-10-10 ENCOUNTER — Ambulatory Visit
Admission: RE | Admit: 2014-10-10 | Discharge: 2014-10-10 | Disposition: A | Discharge status: Home / Self Care | Payer: Medicare Other | Source: Ambulatory Visit | Attending: Endocrinology | Admitting: Endocrinology

## 2014-10-10 DIAGNOSIS — E041 Nontoxic single thyroid nodule: Secondary | ICD-10-CM

## 2014-10-16 ENCOUNTER — Encounter: Payer: Medicare Other | Admitting: Family Medicine

## 2014-11-01 NOTE — Telephone Encounter (Signed)
none

## 2014-11-26 ENCOUNTER — Telehealth: Payer: Self-pay | Admitting: Family Medicine

## 2014-11-26 NOTE — Telephone Encounter (Signed)
Patient is out of town and have to reschedule her 12/03/14 physical.  She want's to know if the doctor can fit her in for a physical.

## 2014-11-26 NOTE — Telephone Encounter (Signed)
Please put her in whatever you can find late spring/early summer  thanks

## 2014-11-27 NOTE — Telephone Encounter (Signed)
Appointment rescheduled for Friday, Jan 31, 2015 at 12:00noon

## 2014-12-03 ENCOUNTER — Encounter: Payer: Self-pay | Admitting: Family Medicine

## 2014-12-19 ENCOUNTER — Encounter: Payer: Self-pay | Admitting: Internal Medicine

## 2015-01-14 ENCOUNTER — Encounter: Payer: Self-pay | Admitting: Internal Medicine

## 2015-01-30 ENCOUNTER — Telehealth: Payer: Self-pay | Admitting: *Deleted

## 2015-01-30 NOTE — Telephone Encounter (Signed)
PT. WAS LAST SEEN BY DR.MAGRINAT ON 03/28/14. AT THAT TIME PT. "GRADUATED" FROM DR.MAGRINAT'S PRACTICE. INSTRUCTED PT. TO CALL HER PRIMARY CARE PHYSICIAN, DR.TOWER. SHE WILL SEE DR.TOWER TOMORROW.

## 2015-01-31 ENCOUNTER — Encounter: Payer: Medicare Other | Admitting: Family Medicine

## 2015-01-31 ENCOUNTER — Telehealth: Payer: Self-pay | Admitting: Primary Care

## 2015-01-31 ENCOUNTER — Encounter: Payer: Self-pay | Admitting: Primary Care

## 2015-01-31 ENCOUNTER — Ambulatory Visit (INDEPENDENT_AMBULATORY_CARE_PROVIDER_SITE_OTHER): Payer: Medicare Other | Admitting: Podiatrist

## 2015-01-31 ENCOUNTER — Encounter: Payer: Self-pay | Admitting: Podiatrist

## 2015-01-31 ENCOUNTER — Ambulatory Visit (INDEPENDENT_AMBULATORY_CARE_PROVIDER_SITE_OTHER): Payer: Medicare Other | Admitting: Primary Care

## 2015-01-31 ENCOUNTER — Ambulatory Visit (INDEPENDENT_AMBULATORY_CARE_PROVIDER_SITE_OTHER): Payer: Medicare Other

## 2015-01-31 VITALS — BP 110/70 | HR 80 | Resp 18

## 2015-01-31 VITALS — BP 128/76 | HR 64 | Temp 97.5°F | Ht 64.0 in | Wt 122.8 lb

## 2015-01-31 DIAGNOSIS — M810 Age-related osteoporosis without current pathological fracture: Secondary | ICD-10-CM | POA: Diagnosis not present

## 2015-01-31 DIAGNOSIS — M2042 Other hammer toe(s) (acquired), left foot: Secondary | ICD-10-CM | POA: Diagnosis not present

## 2015-01-31 DIAGNOSIS — Z1211 Encounter for screening for malignant neoplasm of colon: Secondary | ICD-10-CM | POA: Diagnosis not present

## 2015-01-31 DIAGNOSIS — Z0000 Encounter for general adult medical examination without abnormal findings: Secondary | ICD-10-CM

## 2015-01-31 DIAGNOSIS — L578 Other skin changes due to chronic exposure to nonionizing radiation: Secondary | ICD-10-CM | POA: Diagnosis not present

## 2015-01-31 DIAGNOSIS — G4700 Insomnia, unspecified: Secondary | ICD-10-CM | POA: Diagnosis not present

## 2015-01-31 DIAGNOSIS — E785 Hyperlipidemia, unspecified: Secondary | ICD-10-CM | POA: Diagnosis not present

## 2015-01-31 DIAGNOSIS — Z09 Encounter for follow-up examination after completed treatment for conditions other than malignant neoplasm: Secondary | ICD-10-CM | POA: Diagnosis not present

## 2015-01-31 DIAGNOSIS — R531 Weakness: Secondary | ICD-10-CM

## 2015-01-31 DIAGNOSIS — E039 Hypothyroidism, unspecified: Secondary | ICD-10-CM

## 2015-01-31 LAB — CBC WITH DIFFERENTIAL/PLATELET
BASOS PCT: 0.4 % (ref 0.0–3.0)
Basophils Absolute: 0 10*3/uL (ref 0.0–0.1)
EOS PCT: 0.7 % (ref 0.0–5.0)
Eosinophils Absolute: 0 10*3/uL (ref 0.0–0.7)
HEMATOCRIT: 39.5 % (ref 36.0–46.0)
HEMOGLOBIN: 13.8 g/dL (ref 12.0–15.0)
LYMPHS PCT: 13.3 % (ref 12.0–46.0)
Lymphs Abs: 0.5 10*3/uL — ABNORMAL LOW (ref 0.7–4.0)
MCHC: 34.8 g/dL (ref 30.0–36.0)
MCV: 91.7 fl (ref 78.0–100.0)
Monocytes Absolute: 0.1 10*3/uL (ref 0.1–1.0)
Monocytes Relative: 2.8 % — ABNORMAL LOW (ref 3.0–12.0)
NEUTROS PCT: 82.8 % — AB (ref 43.0–77.0)
Neutro Abs: 3.1 10*3/uL (ref 1.4–7.7)
Platelets: 259 10*3/uL (ref 150.0–400.0)
RBC: 4.32 Mil/uL (ref 3.87–5.11)
RDW: 13.7 % (ref 11.5–15.5)
WBC: 3.8 10*3/uL — AB (ref 4.0–10.5)

## 2015-01-31 LAB — COMPREHENSIVE METABOLIC PANEL
ALK PHOS: 50 U/L (ref 39–117)
ALT: 16 U/L (ref 0–35)
AST: 21 U/L (ref 0–37)
Albumin: 4.7 g/dL (ref 3.5–5.2)
BUN: 11 mg/dL (ref 6–23)
CO2: 30 mEq/L (ref 19–32)
CREATININE: 0.62 mg/dL (ref 0.40–1.20)
Calcium: 10.1 mg/dL (ref 8.4–10.5)
Chloride: 102 mEq/L (ref 96–112)
GFR: 101.46 mL/min (ref >60.00)
Glucose, Bld: 109 mg/dL — ABNORMAL HIGH (ref 70–99)
POTASSIUM: 4.7 meq/L (ref 3.5–5.1)
Sodium: 137 mEq/L (ref 135–145)
Total Bilirubin: 0.4 mg/dL (ref 0.2–1.2)
Total Protein: 7.3 g/dL (ref 6.0–8.3)

## 2015-01-31 LAB — HEMOGLOBIN A1C: HEMOGLOBIN A1C: 5.4 % (ref 4.6–6.5)

## 2015-01-31 LAB — LIPID PANEL
CHOL/HDL RATIO: 3
CHOLESTEROL: 243 mg/dL — AB (ref 0–200)
HDL: 74.6 mg/dL (ref >39.00)
LDL Cholesterol: 150 mg/dL — ABNORMAL HIGH (ref 0–99)
NonHDL: 168.4
TRIGLYCERIDES: 90 mg/dL (ref 0.0–149.0)
VLDL: 18 mg/dL (ref 0.0–40.0)

## 2015-01-31 LAB — VITAMIN D 25 HYDROXY (VIT D DEFICIENCY, FRACTURES): VITD: 61.6 ng/mL (ref 30.00–100.00)

## 2015-01-31 MED ORDER — TRETINOIN 0.025 % EX CREA: TOPICAL_CREAM | Freq: Every day | CUTANEOUS | Status: DC

## 2015-01-31 MED ORDER — ATORVASTATIN CALCIUM 20 MG PO TABS: 20.0000 mg | ORAL_TABLET | Freq: Every day | ORAL | Status: DC

## 2015-01-31 MED ORDER — CLONAZEPAM 1 MG PO TABS: 1.0000 mg | ORAL_TABLET | Freq: Every day | ORAL | Status: DC

## 2015-01-31 NOTE — Telephone Encounter (Signed)
Spoke with Ms. Knopf and agreed to start statin therapy (atorvastatin 20mg  at bedtime). Common side effects explained.  Alyssa Huffman, will you schedule a follow up in 6 months with Dr. Glori Bickers or myself.  Thanks.

## 2015-01-31 NOTE — Patient Instructions (Addendum)
Complete lab work prior to leaving today. I will notify you of your results. Refills have been provided per your request. Please schedule a Dexa bone scan and Mammogram this summer. I have placed the referral to Dr. Heber [place] in [facility] for Colonoscopy. Follow up in 1 year or sooner if needed. Take care!

## 2015-01-31 NOTE — Progress Notes (Signed)
Patient ID: Alyssa Huffman, female   DOB: 11-30-1945, 69 y.o.   MRN: [id number]  HPI: Alyssa Huffman is a 69 year old female who presents today for annual Medicare Wellness Visit  Past Medical History  Diagnosis Date  . DUB (dysfunctional uterine bleeding)   . Adenomyosis   . 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 Personal history of malignant neoplasm of breast   . Malignant neoplasm of colon, unspecified site     hyperplastic  . Personal history of colonic polyps   . Other specified disorders of liver     Hepatic cyst  . Diaphragmatic hernia without mention of obstruction or gangrene   . Unspecified hypothyroidism   . Irritable bowel syndrome   . Cramp of limb   . Hx of migraines   . Disorder of bone and cartilage, unspecified   . Colon polyps   . Hypothyroid   . Cervical dysplasia   . Osteoporosis, unspecified 05/18/2013    Current Outpatient Prescriptions  Medication Sig Dispense Refill  . aspirin 81 MG chewable tablet Chew 81 mg by mouth daily.      Marland Kitchen BIOTIN PO Take by mouth.      . Calcium Carbonate-Vit D-Min (CALTRATE PLUS PO) Take 1 capsule by mouth daily.     . Cholecalciferol 2000 UNITS TABS Take 1 tablet by mouth daily.    . ciclopirox (PENLAC) 8 % solution Apply over nail and surrounding skin. Apply daily over previous coat. After seven (7) days, may remove with alcohol and continue cycle. 6.6 mL 11  . clonazePAM (KLONOPIN) 1 MG tablet     . diazepam (VALIUM) 5 MG tablet     . DiphenhydrAMINE HCl (BENADRYL PO) Take by mouth as needed.      . doxycycline (VIBRAMYCIN) 50 MG capsule     . fish oil-omega-3 fatty acids 1000 MG capsule Take 2 g by mouth daily.     Marland Kitchen glucosamine-chondroitin 500-400 MG tablet Take 1 tablet by mouth 2 (two) times daily.     Marland Kitchen letrozole (FEMARA) 2.5 MG tablet TAKE ONE TABLET BY MOUTH ONCE DAILY 90 tablet 0  . levothyroxine (SYNTHROID, LEVOTHROID) 75 MCG tablet Take 75 mcg by mouth daily.      . Multiple Vitamin  (MULTIVITAMIN) tablet Take 1 tablet by mouth daily.      . promethazine (PHENERGAN) 25 MG tablet     . tretinoin (RETIN-A) 0.025 % cream Apply topically at bedtime. To apply to face for acne once daily 45 g 3  . vitamin C (ASCORBIC ACID) 500 MG tablet Take 500 mg by mouth daily.     No current facility-administered medications for this visit.    Allergies  Allergen Reactions  . Codeine     REACTION: hallucinations    Family History  Problem Relation Age of Onset  . Uterine cancer Mother   . Aneurysm Mother 41  . Cancer Mother     uterine CA  . Colon cancer Maternal Aunt   . Breast cancer Maternal Aunt     Age 50's  . Heart disease Father   . Prostate cancer Father   . Cancer Father     prostate CA  . Colon polyps Brother   . Cancer Cousin     x 2 ? pancreatic  . Cancer Maternal Aunt     Brain tumor    History   Social History  . Marital Status: Married    Spouse Name:  N/A  . Number of Children: 2  . Years of Education: N/A   Occupational History  .     Social History Main Topics  . Smoking status: Never Smoker   . Smokeless tobacco: Never Used  . Alcohol Use: No  . Drug Use: No  . Sexual Activity: Yes    Birth Control/ Protection: Post-menopausal, Surgical   Other Topics Concern  . Not on file   Social History Narrative   Married      2 sons      Unemployed      2 "tall" coffees and 2 soda/day      Regular exercise    Hospitiliaztions: None.  Health Maintenance:    Flu: Last one was Fall of 2015 at [facility] in [place]  Tetanus: Last one was 2009.   Pneumovax: January 2015  Zostavax: April 2014  Bone Density: Completed in 2014, will set up appointment  this summer.  Colonoscopy: Due. She would like a referral for a GI  doctor in [place] to perform the colonoscopy. She  moved there several months ago.  Eye Doctor: Glaucoma screening completed one year  ago. Follows with Alyssa Huffman with opthomology.  Dental Exam: Last cleaning was 6  months ago for  cleaning.    I have personally reviewed and have noted: 1. The patient's medical and social history 2. Their use of alcohol, tobacco or illicit drugs 3. Their current medications and supplements 4. The patient's functional ability including ADL's, fall risks, home safety risks and hearing or  visual impairment. 5. Diet and physical activities 6. Evidence for depression or mood disorder  Subjective:   1) Dry Throat: Extreme dryness to posterior pharynx. She's noticed these small, circular sized, solid, foul smelling balls when coughing. When she notices these she will have foul breath. She will use nasal spray which helps to moisten her nasal cavity. She brushes her teeth regularly. Denies bloody mucous, cough or cold symptoms.  2) Weakness: Occurs everyday after lunch. This has been present several years. She feels as though her blood sugar is low, but this occurs after eating. She's tried energy powders after lunch but will find herself wanting to sleep mostly. Denies bloody stools, vomiting, nausea, change in appetite, abdominal pain. This doesn't occur with any other meal.  3) Refills: Clonazepam 1 mg tablets that she takes for sleep. Has not filled since 04/2014 as she does not use this frequently. Retin-A cream for acne which typically requires insuance approval.  Review of Systems:   Constitutional: Denies fever, malaise, fatigue, headache or abrupt weight changes. See HPI. HEENT: Denies eye pain, eye redness, ear pain, ringing in the ears, wax buildup, runny nose, nasal congestion, bloody nose, or sore throat. See HPI. Respiratory: Denies difficulty breathing, shortness of breath, cough or sputum production.   Cardiovascular: Denies chest pain, chest tightness, palpitations or swelling in the hands or feet.  Gastrointestinal: Denies abdominal pain, bloating, constipation, diarrhea or blood in the stool.  GU: Denies urgency, frequency, pain with urination, burning  sensation, blood in urine, odor or discharge. Musculoskeletal: Denies decrease in range of motion, difficulty with gait, muscle pain or joint pain and swelling.  Skin: Denies redness, rashes, lesions or ulcercations.  Neurological: Denies dizziness, difficulty with memory, difficulty with speech or problems with balance and coordination.   No other specific complaints in a complete review of systems (except as listed in HPI above).  Objective:  PE:   Wt Readings from Last 3 Encounters:  01/31/15 122 lb 12.8 oz (55.702 kg)  02/06/14 120 lb 9.6 oz (54.704 kg)  09/20/13 122 lb (55.339 kg)     General: Appears their stated age, well developed, well nourished in NAD. Skin: Warm, dry and intact. No rashes, lesions or ulcerations noted. HEENT: Head: normal shape and size; Eyes: sclera white, no icterus, conjunctiva pink, PERRLA and EOMs intact; Ears: Tm's gray and intact, normal light reflex; Nose: mucosa pink and moist, septum midline; Throat/Mouth: Teeth present, mucosa pink and moist, no exudate, lesions or ulcerations noted.  Neck: Normal range of motion. Neck supple, trachea midline. No massses, lumps or thyromegaly present.  Cardiovascular: Normal rate and rhythm. S1,S2 noted.  No murmur, rubs or gallops noted. No JVD or BLE edema. No carotid bruits noted. Pulmonary/Chest: Normal effort and positive vesicular breath sounds. No respiratory distress. No wheezes, rales or ronchi noted.  Abdomen: Soft and nontender. Normal bowel sounds, no bruits noted. No distention or masses noted. Liver, spleen and kidneys non palpable. Musculoskeletal: Normal range of motion. No signs of joint swelling. No difficulty with gait.  Neurological: Alert and oriented. Cranial nerves II-XII intact. Coordination normal. +DTRs bilaterally. Psychiatric: Mood and affect normal. Behavior is normal. Judgment and thought content normal.   EKG:  BMET    Component Value Date/Time   NA 140 03/28/2014 0924   NA 141  09/11/2012 1303   K 5.0 03/28/2014 0924   K 4.8 09/11/2012 1303   CL 102 09/11/2012 1303   CL 99 08/24/2012 1500   CO2 28 03/28/2014 0924   CO2 29 09/11/2012 1303   GLUCOSE 82 03/28/2014 0924   GLUCOSE 95 09/11/2012 1303   GLUCOSE 123* 08/24/2012 1500   BUN 11.4 03/28/2014 0924   BUN 9 09/11/2012 1303   CREATININE 0.7 03/28/2014 0924   CREATININE 0.7 09/11/2012 1303   CALCIUM 10.0 03/28/2014 0924   CALCIUM 9.6 09/11/2012 1303   GFRNONAA >60 03/14/2009 1631   GFRAA  03/14/2009 1631    >60        The eGFR has been calculated using the MDRD equation. This calculation has not been validated in all clinical situations. eGFR's persistently <60 mL/min signify possible Chronic Kidney Disease.    Lipid Panel     Component Value Date/Time   CHOL 228* 09/11/2013 1323   TRIG 81.0 09/11/2013 1323   HDL 71.30 09/11/2013 1323   CHOLHDL 3 09/11/2013 1323   VLDL 16.2 09/11/2013 1323   LDLCALC 115* 01/19/2008 0000    CBC    Component Value Date/Time   WBC 3.9 03/28/2014 0924   WBC 4.8 09/11/2012 1303   RBC 4.33 03/28/2014 0924   RBC 4.17 09/11/2012 1303   HGB 13.6 03/28/2014 0924   HGB 13.4 09/11/2012 1303   HCT 40.7 03/28/2014 0924   HCT 39.5 09/11/2012 1303   PLT 262 03/28/2014 0924   PLT 262.0 09/11/2012 1303   MCV 94.2 03/28/2014 0924   MCV 94.6 09/11/2012 1303   MCH 31.3 03/28/2014 0924   MCHC 33.3 03/28/2014 0924   MCHC 34.0 09/11/2012 1303   RDW 13.4 03/28/2014 0924   RDW 13.1 09/11/2012 1303   LYMPHSABS 1.0 03/28/2014 0924   LYMPHSABS 0.8 09/11/2012 1303   MONOABS 0.5 03/28/2014 0924   MONOABS 0.4 09/11/2012 1303   EOSABS 0.1 03/28/2014 0924   EOSABS 0.1 09/11/2012 1303   BASOSABS 0.0 03/28/2014 0924   BASOSABS 0.0 09/11/2012 1303    Hgb A1C No results found for: HGBA1C    Assessment and Plan:  Medicare Annual Wellness Visit:  Diet: Healthy. Follows the weight watchers plan. Chicken, pork, vegetables, fruit. Hardly eats fried food, sweets.   Physical activity: She is exercising three times weekly by running 6 miles a week. Depression/mood screen: Negative Hearing: Intact to whispered voice Visual acuity: Grossly normal, performs annual eye exam  ADLs: Capable Fall risk: None Home safety: Good Cognitive evaluation: Intact to orientation, naming, recall and repetition EOL planning: Adv directives, full code/ I agree  Preventative Medicine:  Current Providers: Dr. Tressia Danas- GYN, Dr. Glori Bickers- PCP, Dr. Magdalene Patricia, Alyssa Huffman- Eye  Next appointment: Schedule follow up with PCP in 6 months for hyperlipidemia. Started Atorvastatin 20 mg tablets daily due to elevated LDL that has been consistent for several years.

## 2015-01-31 NOTE — Progress Notes (Signed)
Pre visit review using our clinic review tool, if applicable. No additional management support is needed unless otherwise documented below in the visit note. 

## 2015-02-02 DIAGNOSIS — R531 Weakness: Secondary | ICD-10-CM | POA: Insufficient documentation

## 2015-02-02 NOTE — Assessment & Plan Note (Addendum)
Reviewed FH, PMH, and Social history. Reviewed heath history including exercise, diet, immunizations, previous and current labs. Started atrovastatin 20 mg due to consistent elevations of LDL over the years despite healthy lifestyle. Referral made for colonoscopy in [place]. Patient is to schedule a Dexa scan and mammogram with her GYN in [place]. Follow up in 6 months for evaluation of cholesterol.

## 2015-02-02 NOTE — Assessment & Plan Note (Signed)
Elevated at 150 and increasing over past several years. Will start low dose statin. Atorvastatin 20 mg daily at bedtime. Education provided regarding side effects, including myalgias. LFT's WNL. Follow up in 6 months with PCP.  Lab Results  Component Value Date   LDLCALC 150* 01/31/2015

## 2015-02-02 NOTE — Assessment & Plan Note (Addendum)
Occurs only after lunch. She will feel weak and fatigued which will cause her to want to sleep. This does not occur after breakfast or dinner. No symptoms today. CBC, CMP, A1C all WNL today. Examination unremarkable. Encouraged patient to seek evaluation in [facility] when symptoms are present for a more complete evaluation.

## 2015-02-03 ENCOUNTER — Encounter: Payer: Self-pay | Admitting: *Deleted

## 2015-02-03 NOTE — Telephone Encounter (Signed)
Left voicemail for patient to call back to schedule follow up. 
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
## 2015-02-03 NOTE — Telephone Encounter (Signed)
Pt returned call, scheduled follow up appointment per encounter note and informed pt that Dr. Georgiann Mohs office in [facility] would be calling to schedule an appointment for her referral after evaluating records / lt

## 2015-02-06 ENCOUNTER — Ambulatory Visit: Payer: Self-pay | Admitting: Podiatrist

## 2015-02-14 ENCOUNTER — Telehealth: Payer: Self-pay | Admitting: Family Medicine

## 2015-02-14 ENCOUNTER — Telehealth: Payer: Self-pay | Admitting: *Deleted

## 2015-02-14 NOTE — Telephone Encounter (Signed)
Received faxed from [facility] in [place], [place] on 02/12/15 Requesting PA for Retin-A. Already sent PA. Waiting respond back from insurance.

## 2015-02-14 NOTE — Telephone Encounter (Signed)
Pt left v/m; pt saw Allie Bossier on 01/31/15 and pt thought she was supposed to have an EKG but did not; pt is scheduled for colonoscopy in Oct and pt wants to know if needs to have EKG prior to colonoscopy. Pt request cb.

## 2015-02-14 NOTE — Telephone Encounter (Signed)
Please notify Ms. Pohl that this was only routine and that she does not need an ECG prior to her colonoscopy. We can do the ECG when she returns for follow up in November. Thanks!

## 2015-02-17 NOTE — Telephone Encounter (Signed)
Called and notified patient of Kate's comments. Patient verbalized understanding.  

## 2015-02-18 NOTE — Telephone Encounter (Signed)
Received faxed confirmation that PA was approved.  [id number]

## 2015-02-24 NOTE — Telephone Encounter (Signed)
Received letter. Case number: [id number] [place] number [id number] for Retin_A (Tretinoin), valid February 14, 2015 thru February 17, 2016.

## 2015-03-12 ENCOUNTER — Telehealth: Payer: Self-pay

## 2015-03-12 NOTE — Telephone Encounter (Signed)
Left voicemail letting pt know to stop Lipitor and to update [place] in 1-2 weeks on how her sxs are

## 2015-03-12 NOTE — Telephone Encounter (Signed)
Pt left v/m; pt has been taking Lipitor 30 mg and recently pt noticing different sensations in legs; few nights have had cramping in calves of legs. Pt is concerned Lipitor 30 mg is too strong for pt. Pt request cb.

## 2015-03-12 NOTE — Telephone Encounter (Signed)
Hold it for 1-2 weeks and let me know if symptoms go away

## 2015-04-02 NOTE — Telephone Encounter (Signed)
Pt left v/m requesting cb about lipitor. Left v/m requesting pt to cb.

## 2015-04-03 NOTE — Telephone Encounter (Signed)
Pt said since stopping the lipitor her leg cramps have stopped. Pt wanted to know what new medication you recommend for pt

## 2015-04-03 NOTE — Telephone Encounter (Signed)
I would recommend crestor (if covered) 10 mg 1 pill every other day - please call in #[phone] ref  In addition she can try a supplement called co enzyme Q 10 over the counter -this may help prevent leg symptoms If that goes well - check fasting lipid profile and ast/alt in 1 month

## 2015-04-04 MED ORDER — ROSUVASTATIN CALCIUM 10 MG PO TABS: 10.0000 mg | ORAL_TABLET | ORAL | Status: DC

## 2015-04-04 NOTE — Telephone Encounter (Signed)
Pt notified of Dr. Marliss Coots instructions/recommendations. Rx sent to pharmacy and pt will call back when she is home and schedule her 1 month lab appt

## 2015-04-04 NOTE — Addendum Note (Signed)
Addended by: Tammi Sou on: 04/04/2015 04:11 PM   Modules accepted: Orders

## 2015-04-23 ENCOUNTER — Telehealth: Payer: Self-pay

## 2015-04-23 NOTE — Telephone Encounter (Signed)
I have not heard of that as a side effect before - but anything is possible  Could she please try taking it in the am instead of the pm for a few days and let me know if that helps ?

## 2015-04-23 NOTE — Telephone Encounter (Signed)
Pt notified of Dr. Marliss Coots recommendations and verbalized understanding, she will try taking med in am to see if that helps

## 2015-04-23 NOTE — Telephone Encounter (Signed)
Pt left v/m; pt taking CO Q 10 and rosuvastatin every other day; pt thinks med causing pt to have insomnia; for 2 nights after taking med pt was wide awake all night. Pt stopped taking med last week and request cb.

## 2015-05-23 NOTE — Telephone Encounter (Signed)
Pt is coming for labs in 05/2015 and pt wants to know if should get EKG then; pt not having any problems or symptoms; advised pt could schedule nurse visit that day and do EKG or if no problems could wait for 07/2015 appt with Dr Glori Bickers. Pt said she will wait for 07/2015 appt.

## 2015-06-04 LAB — HM DEXA SCAN

## 2015-06-05 ENCOUNTER — Encounter: Payer: Self-pay | Admitting: Radiology

## 2015-06-05 ENCOUNTER — Telehealth: Payer: Self-pay | Admitting: Radiology

## 2015-06-05 ENCOUNTER — Telehealth: Payer: Self-pay | Admitting: Family Medicine

## 2015-06-05 ENCOUNTER — Other Ambulatory Visit (INDEPENDENT_AMBULATORY_CARE_PROVIDER_SITE_OTHER): Payer: Medicare Other

## 2015-06-05 DIAGNOSIS — E785 Hyperlipidemia, unspecified: Secondary | ICD-10-CM

## 2015-06-05 LAB — LIPID PANEL
Cholesterol: 178 mg/dL (ref 0–200)
HDL: 74.3 mg/dL (ref >39.00)
LDL CALC: 90 mg/dL (ref 0–99)
NonHDL: 104.1
TRIGLYCERIDES: 71 mg/dL (ref 0.0–149.0)
Total CHOL/HDL Ratio: 2
VLDL: 14.2 mg/dL (ref 0.0–40.0)

## 2015-06-05 LAB — ALT: ALT: 17 U/L (ref 0–35)

## 2015-06-05 LAB — AST: AST: 20 U/L (ref 0–37)

## 2015-06-05 NOTE — Telephone Encounter (Signed)
-----   Message from Ellamae Sia sent at 05/29/2015  3:33 PM EDT ----- Regarding: Lab orders for Thursday, 9.22.16 Lab orders for a 6 month follow up appt

## 2015-06-05 NOTE — Telephone Encounter (Signed)
Patient wants you to know that she fills her cholesterol medication has sped up her heart. If results look better, she would like to cut her dose in half.

## 2015-06-05 NOTE — Telephone Encounter (Signed)
I don't see that as a potential side effect, has she checked her pulse? How fast it it? If she having irregular HR or palpitatoins?   It is ok to cut dose in 1/2 and see if symptoms improve

## 2015-06-06 ENCOUNTER — Encounter: Payer: Self-pay | Admitting: *Deleted

## 2015-06-06 NOTE — Telephone Encounter (Signed)
Left voicemail requesting pt to call office back 

## 2015-06-10 NOTE — Telephone Encounter (Signed)
Left 2nd voicemail requesting pt to call office back 

## 2015-06-11 NOTE — Telephone Encounter (Signed)
Left detailed voicemail letting pt know Dr. Marliss Coots comments and questions about heart sxs. and request pt call back to get a little more info about heart sxs.

## 2015-06-11 NOTE — Telephone Encounter (Signed)
Pt would like you to call [phone]  Name on voice mail is brandi Just wants you to leave message on voicemail she will call back if she has any questions She would like a hard copy of lab results mailed

## 2015-06-12 ENCOUNTER — Other Ambulatory Visit: Payer: Self-pay | Admitting: Radiology

## 2015-06-12 ENCOUNTER — Encounter: Payer: Self-pay | Admitting: Family Medicine

## 2015-06-12 DIAGNOSIS — Z803 Family history of malignant neoplasm of breast: Secondary | ICD-10-CM

## 2015-06-12 DIAGNOSIS — C50911 Malignant neoplasm of unspecified site of right female breast: Secondary | ICD-10-CM
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
## 2015-06-25 NOTE — Telephone Encounter (Signed)
Pt said her increased heart rate didn't start until she started taking the Crestor, pt said she is only taking 1/2 tab every other day right now and hasn't seen to much of a difference in her heart rate. Pt said she has a pulse ox at home and her heart rates was 98 when she checked it last, and before that it was 69 and 105 so it does vary. Pt said she has been having a heart time sleeping at night and that's when she can feel her heart beating really hard, she said it feels like it's beating out of her chest. Pt said she didn't want to get addicted to her Klonopin so she has been taking 2 tabs of Aleve PM for about the last 6 month because she isn't sleeping. Pt wasn't sure if she had any palpitations she just said it feels like it's beating hard and fast.   Also pt said she has been exercising and she monitors her heart rate while exercising so she wanted me to ask Dr. Glori Bickers how high can her heart rate go up and it still be okay when she is exercising  Pt also has a f/u on 08/11/15 and wanted to know if she can get a EKG done at her f/u (I changed her appt to a 71min appt just incase but I can change it back if need be)  Pt also wanted me to tell you that her father had heart attack age 26

## 2015-06-25 NOTE — Telephone Encounter (Signed)
Stop crestor entirely - doubt it is that but just to see  F/u with me in 1-2 weeks (earlier than planned)- 30 min IF possible  She has a lot going on- let's see what we can figure out

## 2015-06-25 NOTE — Telephone Encounter (Signed)
Left detailed voicemail letting pt know to call back and schedule a f/u appt with Dr. Glori Bickers (43min) to address heart issues and to stop taking the crestor, pt had also left me a voicemail requesting Dr. Glori Bickers view her DEXA scan too at her f/u appt

## 2015-07-01 ENCOUNTER — Encounter: Payer: Self-pay | Admitting: Family Medicine

## 2015-07-01 ENCOUNTER — Ambulatory Visit: Payer: Medicare Other | Admitting: Family Medicine

## 2015-07-09 ENCOUNTER — Encounter: Payer: Self-pay | Admitting: Family Medicine

## 2015-07-09 ENCOUNTER — Ambulatory Visit (INDEPENDENT_AMBULATORY_CARE_PROVIDER_SITE_OTHER): Payer: Medicare Other | Admitting: Family Medicine

## 2015-07-09 VITALS — BP 104/76 | HR 63 | Temp 98.0°F | Ht 64.0 in | Wt 124.0 lb

## 2015-07-09 DIAGNOSIS — E785 Hyperlipidemia, unspecified: Secondary | ICD-10-CM

## 2015-07-09 DIAGNOSIS — M810 Age-related osteoporosis without current pathological fracture: Secondary | ICD-10-CM | POA: Diagnosis not present

## 2015-07-09 DIAGNOSIS — G4700 Insomnia, unspecified: Secondary | ICD-10-CM | POA: Insufficient documentation

## 2015-07-09 DIAGNOSIS — R002 Palpitations: Secondary | ICD-10-CM | POA: Diagnosis not present

## 2015-07-09 NOTE — Patient Instructions (Signed)
Stop aleve if you do not need it for pain For sleep- try 25-50 mg of benadryl at night  You have osteoporosis of the foream , borderline at the hip and normal score in the spine  I would not start you on medication for fracture prevention given current clinical history  I'm glad palpitations are better- stay off cholesterol medicine  Try the fish oil twice daily Avoid red meat/ fried foods/ egg yolks/ fatty breakfast meats/ butter, cheese and high fat dairy/ and shellfish   Stay active

## 2015-07-09 NOTE — Assessment & Plan Note (Signed)
On crestor  Now resolved Will stay off statins Continue to watch lipids (high HDL)  Fish oil  Disc goals for lipids and reasons to control them Rev labs with pt Rev low sat fat diet in detail

## 2015-07-09 NOTE — Progress Notes (Signed)
Subjective:    Patient ID: Alyssa Huffman, female    DOB: Jan 30, 1946, 69 y.o.   MRN: [id number]  HPI Here for multiple medical issues   Palpitations  Thought at first they were coming from her crestor - and she dec to 1/2 pill every other day It was running in the 90s and over 100 on several occasions  EKG looks ok -nl rhythm with rate of 62  Now off crestor -no more palpitations   Lab Results  Component Value Date   CHOL 178 06/05/2015   HDL 74.30 06/05/2015   LDLCALC 90 06/05/2015   LDLDIRECT 134.1 09/11/2013   TRIG 71.0 06/05/2015   CHOLHDL 2 06/05/2015    Takes one fish oil per day   Sleep problems-on the crestor (better now)  Takes aleve pm to help her sleep    OP dexa recent from Dr Helane Rima: Forearm OP- down 11%  FN T score -1.7 and -1.8 mixed trend/fairly stablel   LD nl T score -0.2 , up 4.3 %  (? Some scoliosis) Frax: major Op fx 16.3% and hip 3.3 %  Was on femara in the past  Nl D level  Falls -none /no fractures   She has had side effects from 2 different medications    Patient Active Problem List   Diagnosis Date Noted  . Palpitations 07/09/2015  . Insomnia 07/09/2015  . Weakness generalized 02/02/2015  . Acne 09/19/2013  . Lipoma 09/19/2013  . Colon cancer screening 09/19/2013  . Encounter for Medicare annual wellness exam 09/10/2013  . Breast cancer of upper-outer quadrant of right female breast ([place]) 08/24/2013  . Osteoporosis 05/18/2013  . Bunion, left foot 05/09/2013  . Cervical dysplasia   . Hyperlipidemia 07/28/2011  . Fungal nail infection 07/28/2011  . HIATAL HERNIA 03/02/2010  . CONSTIPATION 03/02/2010  . RECTAL BLEEDING 03/02/2010  . HEPATIC CYST 03/02/2010  . COLONIC POLYPS, HYPERPLASTIC, HX OF 03/02/2010  . LEG CRAMPS, NOCTURNAL 01/19/2008  . HYPOTHYROIDISM 01/17/2008  . IBS 01/17/2008  . MIGRAINES, HX OF 01/17/2008   Past Medical History  Diagnosis Date  . DUB (dysfunctional uterine bleeding)   . Adenomyosis   .  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 Personal history of malignant neoplasm of breast   . Malignant neoplasm of colon, unspecified site     hyperplastic  . Personal history of colonic polyps   . Other specified disorders of liver     Hepatic cyst  . Diaphragmatic hernia without mention of obstruction or gangrene   . Unspecified hypothyroidism   . Irritable bowel syndrome   . Cramp of limb   . Hx of migraines   . Disorder of bone and cartilage, unspecified   . Colon polyps   . Hypothyroid   . Cervical dysplasia   . Osteoporosis, unspecified 05/18/2013   Past Surgical History  Procedure Laterality Date  . Breast lumpectomy      right  . Breast enhancement surgery      bilateral  . Breast implant removal      bilateral  . Partial hysterectomy  1980    endometriosis  . Hemorroidectomy  1990's  . Tubal ligation    . Septoplasty    . Appendectomy  1965  . Skin biopsy    . Vaginal hysterectomy    . Gynecologic cryosurgery    . Colposcopy    . Augmentation mammaplasty     Social History  Substance Use Topics  .  Smoking status: Never Smoker   . Smokeless tobacco: Never Used  . Alcohol Use: No   Family History  Problem Relation Age of Onset  . Uterine cancer Mother   . Aneurysm Mother 39  . Cancer Mother     uterine CA  . Colon cancer Maternal Aunt   . Breast cancer Maternal Aunt     Age 4's  . Heart disease Father   . Prostate cancer Father   . Cancer Father     prostate CA  . Colon polyps Brother   . Cancer Cousin     x 2 ? pancreatic  . Cancer Maternal Aunt     Brain tumor   Allergies  Allergen Reactions  . Bisphosphonates Other (See Comments)    Aches and pains   . Codeine     REACTION: hallucinations  . Crestor [Rosuvastatin Calcium] Palpitations    Palpitations and insomnia    Current Outpatient Prescriptions on File Prior to Visit  Medication Sig Dispense Refill  . aspirin 81 MG chewable tablet Chew 81 mg by mouth  daily.      Marland Kitchen BIOTIN PO Take by mouth.      . Calcium Carbonate-Vit D-Min (CALTRATE PLUS PO) Take 1 capsule by mouth daily.     . Cholecalciferol 2000 UNITS TABS Take 1 tablet by mouth daily.    . ciclopirox (PENLAC) 8 % solution Apply over nail and surrounding skin. Apply daily over previous coat. After seven (7) days, may remove with alcohol and continue cycle. 6.6 mL 11  . clonazePAM (KLONOPIN) 1 MG tablet Take 1 tablet (1 mg total) by mouth at bedtime. 30 tablet 0  . fish oil-omega-3 fatty acids 1000 MG capsule Take 2 g by mouth daily.     Marland Kitchen glucosamine-chondroitin 500-400 MG tablet Take 1 tablet by mouth 2 (two) times daily.     Marland Kitchen levothyroxine (SYNTHROID, LEVOTHROID) 75 MCG tablet Take 75 mcg by mouth daily.      . Multiple Vitamin (MULTIVITAMIN) tablet Take 1 tablet by mouth daily.      . promethazine (PHENERGAN) 25 MG tablet     . tretinoin (RETIN-A) 0.025 % cream Apply topically at bedtime. To apply to face for acne once daily 45 g 3  . vitamin C (ASCORBIC ACID) 500 MG tablet Take 500 mg by mouth daily.     No current facility-administered medications on file prior to visit.    Review of Systems Review of Systems  Constitutional: Negative for fever, appetite change, fatigue and unexpected weight change.  Eyes: Negative for pain and visual disturbance.  Respiratory: Negative for cough and shortness of breath.   Cardiovascular: Negative for cp or palpitations   (palpitations are resolved) Gastrointestinal: Negative for nausea, diarrhea and constipation.  Genitourinary: Negative for urgency and frequency.  Skin: Negative for pallor or rash   Neurological: Negative for weakness, light-headedness, numbness and headaches.  Hematological: Negative for adenopathy. Does not bruise/bleed easily.  Psychiatric/Behavioral: Negative for dysphoric mood. The patient is not nervous/anxious.  pos for insomnia        Objective:   Physical Exam  Constitutional: She appears well-developed and  well-nourished. No distress.  Well appearing   HENT:  Head: Normocephalic and atraumatic.  Mouth/Throat: Oropharynx is clear and moist.  Eyes: Conjunctivae and EOM are normal. Pupils are equal, round, and reactive to light.  Neck: Normal range of motion. Neck supple. No JVD present. Carotid bruit is not present. No thyromegaly present.  Cardiovascular: Normal rate, regular  rhythm, normal heart sounds and intact distal pulses.  Exam reveals no gallop.   Pulmonary/Chest: Effort normal and breath sounds normal. No respiratory distress. She has no wheezes. She has no rales.  No crackles  Abdominal: Soft. Bowel sounds are normal. She exhibits no distension, no abdominal bruit and no mass. There is no tenderness.  Musculoskeletal: She exhibits no edema or tenderness.  No kyphosis  No acute joint changes   Lymphadenopathy:    She has no cervical adenopathy.  Neurological: She is alert. She has normal reflexes. She displays no tremor. No cranial nerve deficit. She exhibits normal muscle tone. Coordination normal.  Skin: Skin is warm and dry. No rash noted.  Psychiatric: She has a normal mood and affect.  Mood is good  Not anxious  Pleasant and talkative           Assessment & Plan:   Problem List Items Addressed This Visit      Musculoskeletal and Integument   Osteoporosis    Rev dexa 9/16 Forearm OP/hips osteopenia and LS normal (slt scoliosis) Rev with her Prev hx of femara for breast cancer  She has tried 2 meds for OP and could not tolerate them  Nl D level and no falls or fx  Would not currently put her on more medication-she will disc further with gyn   If she has falls or other risk factors would re consider  Repeat dexa 2 y  Continue gyn f/u         Other   Hyperlipidemia    Intol of crestor- palpitations and sleeplessness  Stopped it  Disc goals for lipids and reasons to control them Rev labs with pt Rev low sat fat diet in detail  Will work on diet and  continue fish oil      Insomnia    Pt takes aleve pm  Since this is not for pain - I suggested changing to 25-50 mg of benadryl instead Will update  Has klonopin for prn use as well       Palpitations - Primary    On crestor  Now resolved Will stay off statins Continue to watch lipids (high HDL)  Fish oil  Disc goals for lipids and reasons to control them Rev labs with pt Rev low sat fat diet in detail       Relevant Orders   EKG 12-Lead (Completed)

## 2015-07-09 NOTE — Assessment & Plan Note (Signed)
Intol of crestor- palpitations and sleeplessness  Stopped it  Disc goals for lipids and reasons to control them Rev labs with pt Rev low sat fat diet in detail  Will work on diet and continue fish oil

## 2015-07-09 NOTE — Progress Notes (Signed)
Pre visit review using our clinic review tool, if applicable. No additional management support is needed unless otherwise documented below in the visit note. 

## 2015-07-09 NOTE — Assessment & Plan Note (Signed)
Rev dexa 9/16 Forearm OP/hips osteopenia and LS normal (slt scoliosis) Rev with her Prev hx of femara for breast cancer  She has tried 2 meds for OP and could not tolerate them  Nl D level and no falls or fx  Would not currently put her on more medication-she will disc further with gyn   If she has falls or other risk factors would re consider  Repeat dexa 2 y  Continue gyn f/u

## 2015-07-09 NOTE — Assessment & Plan Note (Signed)
Pt takes aleve pm  Since this is not for pain - I suggested changing to 25-50 mg of benadryl instead Will update  Has klonopin for prn use as well

## 2015-08-11 ENCOUNTER — Ambulatory Visit: Payer: Medicare Other | Admitting: Family Medicine

## 2015-08-26 ENCOUNTER — Other Ambulatory Visit: Payer: Medicare Other

## 2015-09-03 ENCOUNTER — Ambulatory Visit
Admission: RE | Admit: 2015-09-03 | Discharge: 2015-09-03 | Disposition: A | Discharge status: Home / Self Care | Payer: Medicare Other | Source: Ambulatory Visit | Attending: Radiology | Admitting: Radiology

## 2015-09-03 DIAGNOSIS — Z803 Family history of malignant neoplasm of breast: Secondary | ICD-10-CM

## 2015-09-03 DIAGNOSIS — C50911 Malignant neoplasm of unspecified site of right female breast: Secondary | ICD-10-CM

## 2015-09-03 MED ORDER — GADOBENATE DIMEGLUMINE 529 MG/ML IV SOLN
11.0000 mL | Freq: Once | INTRAVENOUS | Status: AC | PRN
  Administered 2015-09-03: 11 mL via INTRAVENOUS

## 2015-09-18 ENCOUNTER — Other Ambulatory Visit: Payer: Self-pay | Admitting: Radiology

## 2015-09-18 DIAGNOSIS — Z803 Family history of malignant neoplasm of breast: Secondary | ICD-10-CM

## 2015-09-18 DIAGNOSIS — Z853 Personal history of malignant neoplasm of breast: Secondary | ICD-10-CM

## 2015-10-14 ENCOUNTER — Other Ambulatory Visit: Payer: Self-pay

## 2015-10-14 ENCOUNTER — Ambulatory Visit
Admission: RE | Admit: 2015-10-14 | Discharge: 2015-10-14 | Disposition: A | Discharge status: Home / Self Care | Payer: Medicare Other | Source: Ambulatory Visit | Attending: Radiology | Admitting: Radiology

## 2015-10-14 DIAGNOSIS — Z853 Personal history of malignant neoplasm of breast: Secondary | ICD-10-CM

## 2015-10-14 DIAGNOSIS — Z803 Family history of malignant neoplasm of breast: Secondary | ICD-10-CM

## 2015-10-14 DIAGNOSIS — G4700 Insomnia, unspecified: Secondary | ICD-10-CM

## 2015-10-14 MED ORDER — CLONAZEPAM 1 MG PO TABS: 1.0000 mg | ORAL_TABLET | Freq: Every day | ORAL | Status: DC

## 2015-10-14 NOTE — Telephone Encounter (Signed)
Alyssa Huffman with [place] called to verigy clonazepam was [place]. Reviewed clonazepam rx with Alyssa Huffman and that was all that was needed.

## 2015-10-14 NOTE — Telephone Encounter (Signed)
Px written for call in   

## 2015-10-14 NOTE — Telephone Encounter (Signed)
Pt left v/m requesting refill clonazepam to [facility] [place]. Last refilled #30 on 01/31/15. Pt last seen 07/09/15.Please advise.

## 2015-10-14 NOTE — Telephone Encounter (Signed)
Rx called in as prescribed 

## 2015-10-30 ENCOUNTER — Telehealth: Payer: Self-pay | Admitting: Family Medicine

## 2015-10-30 DIAGNOSIS — Z0000 Encounter for general adult medical examination without abnormal findings: Secondary | ICD-10-CM

## 2015-10-30 NOTE — Telephone Encounter (Signed)
What type of specialist? For what dx?  thanks

## 2015-10-30 NOTE — Telephone Encounter (Signed)
Pt called needing to get a referral to Dr Gwen Her md to make a new patient appointment   Dr  Phone [phone]

## 2015-10-30 NOTE — Telephone Encounter (Signed)
Pt is trying to Est Care with doctors in [facility] since she has moved, Dr. Gwen Her is a GYN/ Family med doc so she can see one doc for everything, she is just trying to Est care with her (no issues) but they advise her she has to have a referral from old doc's office and also we have to send them her records   (Dr. Ansel Bong address: [address], [place], [place] [address])

## 2015-10-30 NOTE — Telephone Encounter (Signed)
No problem- I looked her up and she was in [facility]- and I was confused-that clears it up  Ref done-will route to [facility]

## 2015-11-04 NOTE — Telephone Encounter (Signed)
Called Dr Ansel Bong office, she is a GYN and only accepting patients with a problem, patient will try to find another Dr.

## 2015-11-13 LAB — HM COLONOSCOPY: HM Colonoscopy: ABNORMAL — AB

## 2016-02-03 ENCOUNTER — Ambulatory Visit (INDEPENDENT_AMBULATORY_CARE_PROVIDER_SITE_OTHER): Payer: Medicare Other

## 2016-02-03 ENCOUNTER — Ambulatory Visit (INDEPENDENT_AMBULATORY_CARE_PROVIDER_SITE_OTHER): Payer: Medicare Other | Admitting: Family Medicine

## 2016-02-03 ENCOUNTER — Encounter: Payer: Self-pay | Admitting: Family Medicine

## 2016-02-03 VITALS — BP 118/76 | HR 59 | Temp 97.9°F | Ht 63.5 in | Wt 122.5 lb

## 2016-02-03 DIAGNOSIS — Z0000 Encounter for general adult medical examination without abnormal findings: Secondary | ICD-10-CM

## 2016-02-03 DIAGNOSIS — G4700 Insomnia, unspecified: Secondary | ICD-10-CM

## 2016-02-03 DIAGNOSIS — Z23 Encounter for immunization: Secondary | ICD-10-CM

## 2016-02-03 DIAGNOSIS — E039 Hypothyroidism, unspecified: Secondary | ICD-10-CM

## 2016-02-03 DIAGNOSIS — L578 Other skin changes due to chronic exposure to nonionizing radiation: Secondary | ICD-10-CM

## 2016-02-03 DIAGNOSIS — C50411 Malignant neoplasm of upper-outer quadrant of right female breast: Secondary | ICD-10-CM | POA: Diagnosis not present

## 2016-02-03 DIAGNOSIS — E785 Hyperlipidemia, unspecified: Secondary | ICD-10-CM

## 2016-02-03 DIAGNOSIS — Z1159 Encounter for screening for other viral diseases: Secondary | ICD-10-CM

## 2016-02-03 DIAGNOSIS — M810 Age-related osteoporosis without current pathological fracture: Secondary | ICD-10-CM

## 2016-02-03 MED ORDER — CLONAZEPAM 1 MG PO TABS: 1.0000 mg | ORAL_TABLET | Freq: Every day | ORAL | Status: DC

## 2016-02-03 MED ORDER — TRETINOIN 0.025 % EX CREA: TOPICAL_CREAM | Freq: Every day | CUTANEOUS | Status: AC

## 2016-02-03 NOTE — Progress Notes (Signed)
Subjective:   Alyssa Huffman is a 70 y.o. female who presents for Medicare Annual (Subsequent) preventive examination.  Review of Systems:  N/A Cardiac Risk Factors include: advanced age (>44men, >12 women);dyslipidemia     Objective:     Vitals: BP 118/76 mmHg  Pulse 59  Temp(Src) 97.9 F (36.6 C) (Oral)  Ht 5' 3.5" (1.613 m)  Wt 122 lb 8 oz (55.566 kg)  BMI 21.36 kg/m2  SpO2 97%  Body mass index is 21.36 kg/(m^2).   Tobacco History  Smoking status  . Never Smoker   Smokeless tobacco  . Never Used     Counseling given: No   Past Medical History  Diagnosis Date  . DUB (dysfunctional uterine bleeding)   . Adenomyosis   . 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 Personal history of malignant neoplasm of breast   . Malignant neoplasm of colon, unspecified site     hyperplastic  . Personal history of colonic polyps   . Other specified disorders of liver     Hepatic cyst  . Diaphragmatic hernia without mention of obstruction or gangrene   . Unspecified hypothyroidism   . Irritable bowel syndrome   . Cramp of limb   . Hx of migraines   . Disorder of bone and cartilage, unspecified   . Colon polyps   . Hypothyroid   . Cervical dysplasia   . Osteoporosis, unspecified 05/18/2013   Past Surgical History  Procedure Laterality Date  . Breast lumpectomy      right  . Breast enhancement surgery      bilateral  . Breast implant removal      bilateral  . Partial hysterectomy  1980    endometriosis  . Hemorroidectomy  1990's  . Tubal ligation    . Septoplasty    . Appendectomy  1965  . Skin biopsy    . Vaginal hysterectomy    . Gynecologic cryosurgery    . Colposcopy    . Augmentation mammaplasty     Family History  Problem Relation Age of Onset  . Uterine cancer Mother   . Aneurysm Mother 32  . Cancer Mother     uterine CA  . Colon cancer Maternal Aunt   . Breast cancer Maternal Aunt     Age 59's  . Heart disease  Father   . Prostate cancer Father   . Cancer Father     prostate CA  . Colon polyps Brother   . Cancer Cousin     x 2 ? pancreatic  . Cancer Maternal Aunt     Brain tumor   History  Sexual Activity  . Sexual Activity: No    Outpatient Encounter Prescriptions as of 02/03/2016  Medication Sig  . aspirin 81 MG chewable tablet Chew 81 mg by mouth daily.    . Biotin 5000 MCG TABS Take 10,000 mcg by mouth every morning.  Marland Kitchen BIOTIN PO Take 5,000 mcg by mouth at bedtime.   . Calcium Carbonate-Vit D-Min (CALTRATE PLUS PO) Take 1 capsule by mouth daily.   . Cholecalciferol 2000 UNITS TABS Take 1 tablet by mouth daily.  . diphenhydrAMINE (BENADRYL) 25 mg capsule Take 37.5 mg by mouth at bedtime as needed.  . fish oil-omega-3 fatty acids 1000 MG capsule Take 1 g by mouth daily.   Marland Kitchen glucosamine-chondroitin 500-400 MG tablet Take 1 tablet by mouth daily.   Marland Kitchen levothyroxine (SYNTHROID, LEVOTHROID) 75 MCG tablet Take 75 mcg by  mouth daily.    . Multiple Vitamin (MULTIVITAMIN) tablet Take 1 tablet by mouth daily.    . vitamin C (ASCORBIC ACID) 500 MG tablet Take 500 mg by mouth daily.  . [DISCONTINUED] clonazePAM (KLONOPIN) 1 MG tablet Take 1 tablet (1 mg total) by mouth at bedtime.  . [DISCONTINUED] tretinoin (RETIN-A) 0.025 % cream Apply topically at bedtime. To apply to face for acne once daily  . [DISCONTINUED] ciclopirox (PENLAC) 8 % solution Apply over nail and surrounding skin. Apply daily over previous coat. After seven (7) days, may remove with alcohol and continue cycle.  . [DISCONTINUED] promethazine (PHENERGAN) 25 MG tablet    No facility-administered encounter medications on file as of 02/03/2016.    Activities of Daily Living In your present state of health, do you have any difficulty performing the following activities: 02/03/2016  Hearing? N  Vision? N  Difficulty concentrating or making decisions? N  Walking or climbing stairs? N  Dressing or bathing? N  Doing errands, shopping?  N  Preparing Food and eating ? N  Using the Toilet? N  In the past six months, have you accidently leaked urine? N  Do you have problems with loss of bowel control? N  Managing your Medications? N  Managing your Finances? N  Housekeeping or managing your Housekeeping? N    Patient Care Team: Abner Greenspan, MD as PCP - General    Assessment:     Hearing Screening   125Hz  250Hz  500Hz  1000Hz  2000Hz  4000Hz  8000Hz   Right ear:   40 40 40 0   Left ear:   40 40 40 0   Vision Screening Comments: Last eye exam in 2014 with Dr. Bing Plume   Exercise Activities and Dietary recommendations Current Exercise Habits: Home exercise routine, Type of exercise: treadmill;Other - see comments (floor exercises), Time (Minutes): 45, Frequency (Times/Week): 3, Weekly Exercise (Minutes/Week): 135, Intensity: Moderate, Exercise limited by: None identified  Goals    . Increase physical activity     Starting 02/03/2016, I will continue to exercise for at least 30-60 min 3 days per week.       Fall Risk Fall Risk  02/03/2016 02/03/2016 09/19/2013 09/11/2012 09/11/2012  Falls in the past year? Yes Yes No No No  Number falls in past yr: 1 1 - - -  Injury with Fall? Yes Yes - - -  Risk Factor Category  High Fall Risk High Fall Risk - - -  Follow up Falls evaluation completed Falls evaluation completed - - -   Depression Screen PHQ 2/9 Scores 02/03/2016 02/03/2016 09/19/2013 09/11/2012  PHQ - 2 Score 0 0 0 0     Cognitive Testing MMSE - Mini Mental State Exam 02/03/2016  Orientation to time 5  Orientation to Place 5  Registration 3  Attention/ Calculation 0  Recall 3  Language- name 2 objects 0  Language- repeat 1  Language- follow 3 step command 3  Language- read & follow direction 0  Write a sentence 0  Copy design 0  Total score 20   PLEASE NOTE: A Mini-Cog screen was completed. Maximum score is 20. A value of 0 denotes this part of Folstein MMSE was not completed or the patient failed this part of the  Mini-Cog screening.   Mini-Cog Screening Orientation to Time - Max 5 pts Orientation to Place - Max 5 pts Registration - Max 3 pts Recall - Max 3 pts Language Repeat - Max 1 pts Language Follow 3 Step Command - Max  3 pts  Immunization History  Administered Date(s) Administered  . Influenza Split 07/28/2011  . Influenza,inj,Quad PF,36+ Mos 09/19/2013  . Pneumococcal Conjugate-13 02/03/2016  . Pneumococcal Polysaccharide-23 09/19/2013  . Td 01/19/2008  . Zoster 12/19/2012   Screening Tests Health Maintenance  Topic Date Due  . INFLUENZA VACCINE  10/15/2020 (Originally 04/13/2016)  . MAMMOGRAM  06/03/2017  . PAP SMEAR  11/25/2017  . TETANUS/TDAP  01/18/2018  . COLONOSCOPY  01/31/2018  . DEXA SCAN  Completed  . ZOSTAVAX  Addressed  . Hepatitis C Screening  Completed  . PNA vac Low Risk Adult  Completed      Plan:    I have personally reviewed and addressed the Medicare Annual Wellness questionnaire and have noted the following in the patient's chart:  A. Medical and social history B. Use of alcohol, tobacco or illicit drugs  C. Current medications and supplements D. Functional ability and status E.  Nutritional status F.  Physical activity G. Advance directives H. List of other physicians I.  Hospitalizations, surgeries, and ER visits in previous 12 months J.  [place] to include hearing, vision, cognitive, depression L. Referrals and appointments - none  In addition, I have reviewed and discussed with patient certain preventive protocols, quality metrics, and best practice recommendations. A written personalized care plan for preventive services as well as general preventive health recommendations were provided to patient.  See attached scanned questionnaire for additional information.   Signed,   Lindell Noe, MHA, BS, LPN Health Advisor

## 2016-02-03 NOTE — Progress Notes (Signed)
   Subjective:    Patient ID: Alyssa Huffman, female    DOB: Jul 10, 1946, 70 y.o.   MRN: [id number]  HPI    Review of Systems     Objective:   Physical Exam        Assessment & Plan:  I reviewed health advisor's note, was available for consultation, and agree with documentation and plan.

## 2016-02-03 NOTE — Patient Instructions (Signed)
Labs today  Here is some information on Prolia - an osteoporosis medicine-please contact insurance to see if they cover it if you are interested  Let me know  Continue exercise and calcium and vitamin D and low cholesterol diet

## 2016-02-03 NOTE — Progress Notes (Signed)
PCP notes:  Health maintenance:   Hep C screening - completed PCV13 - administered Pap Smear - per pt, completed in March 2017  Abnormal screenings:   Fall risk - hx of fall with injury in previous 12 mths  Patient concerns: None  Nurse concerns: None  Next PCP appt: 02/03/16 @ 1430 (scheduled; same day)

## 2016-02-03 NOTE — Patient Instructions (Signed)
Ms. Setterberg , Thank you for taking time to come for your Medicare Wellness Visit. I appreciate your ongoing commitment to your health goals. Please review the following plan we discussed and let me know if I can assist you in the future.   These are the goals we discussed: Goals    . Increase physical activity     Starting 02/03/2016, I will continue to exercise for at least 30-60 min 3 days per week.        This is a list of the screening recommended for you and due dates:  Health Maintenance  Topic Date Due  . Flu Shot  10/15/2020*  . Mammogram  06/03/2017  . Pap Smear  11/25/2017  . Tetanus Vaccine  01/18/2018  . Colon Cancer Screening  01/31/2018  . DEXA scan (bone density measurement)  Completed  . Shingles Vaccine  Addressed  .  Hepatitis C: One time screening is recommended by Center for Disease Control  (CDC) for  adults born from [birth year] through 1965.   Completed  . Pneumonia vaccines  Completed  *Topic was postponed. The date shown is not the original due date.    Preventive Care for Adults  A healthy lifestyle and preventive care can promote health and wellness. Preventive health guidelines for adults include the following key practices.  . A routine yearly physical is a good way to check with your health care provider about your health and preventive screening. It is a chance to share any concerns and updates on your health and to receive a thorough exam.  . Visit your dentist for a routine exam and preventive care every 6 months. Brush your teeth twice a day and floss once a day. Good oral hygiene prevents tooth decay and gum disease.  . The frequency of eye exams is based on your age, health, family medical history, use  of contact lenses, and other factors. Follow your health care provider's ecommendations for frequency of eye exams.  . Eat a healthy diet. Foods like vegetables, fruits, whole grains, low-fat dairy products, and lean protein foods contain the nutrients  you need without too many calories. Decrease your intake of foods high in solid fats, added sugars, and salt. Eat the right amount of calories for you. Get information about a proper diet from your health care provider, if necessary.  . Regular physical exercise is one of the most important things you can do for your health. Most adults should get at least 150 minutes of moderate-intensity exercise (any activity that increases your heart rate and causes you to sweat) each week. In addition, most adults need muscle-strengthening exercises on 2 or more days a week.  Silver Sneakers may be a benefit available to you. To determine eligibility, you may visit the website: www.silversneakers.com or contact program at 802-017-1317 Mon-Fri between 8AM-8PM.   . Maintain a healthy weight. The body mass index (BMI) is a screening tool to identify possible weight problems. It provides an estimate of body fat based on height and weight. Your health care provider can find your BMI and can help you achieve or maintain a healthy weight.   For adults 20 years and older: ? A BMI below 18.5 is considered underweight. ? A BMI of 18.5 to 24.9 is normal. ? A BMI of 25 to 29.9 is considered overweight. ? A BMI of 30 and above is considered obese.   . Maintain normal blood lipids and cholesterol levels by exercising and minimizing your intake of saturated  fat. Eat a balanced diet with plenty of fruit and vegetables. Blood tests for lipids and cholesterol should begin at age 15 and be repeated every 5 years. If your lipid or cholesterol levels are high, you are over 50, or you are at high risk for heart disease, you may need your cholesterol levels checked more frequently. Ongoing high lipid and cholesterol levels should be treated with medicines if diet and exercise are not working.  . If you smoke, find out from your health care provider how to quit. If you do not use tobacco, please do not start.  . If you choose to  drink alcohol, please do not consume more than 2 drinks per day. One drink is considered to be 12 ounces (355 mL) of beer, 5 ounces (148 mL) of wine, or 1.5 ounces (44 mL) of liquor.  . If you are 31-5 years old, ask your health care provider if you should take aspirin to prevent strokes.  . Use sunscreen. Apply sunscreen liberally and repeatedly throughout the day. You should seek shade when your shadow is shorter than you. Protect yourself by wearing long sleeves, pants, a wide-brimmed hat, and sunglasses year round, whenever you are outdoors.  . Once a month, do a whole body skin exam, using a mirror to look at the skin on your back. Tell your health care provider of new moles, moles that have irregular borders, moles that are larger than a pencil eraser, or moles that have changed in shape or color.

## 2016-02-03 NOTE — Assessment & Plan Note (Signed)
Refilled klonopin for prn use

## 2016-02-03 NOTE — Progress Notes (Signed)
Subjective:    Patient ID: Alyssa Huffman, female    DOB: 06-12-1946, 70 y.o.   MRN: [id number]  HPI Here for health maintenance exam and to review chronic medical problems    Feeling ok overall  Taking care of herself  Feels her age   31 with Katha Cabal today went well/no concerns  No change in hearing  Had prevnar vaccine Hep C screen ordered  Wt is down 2 lb with bmi of 21 Still exercises  Eats fairly well - but does not tend to eat bkfast  Lunch- protein- smoothie  Dinner - well rounded meal   Mm 9/16 nl  Hx of breast ca in 2014- MRI was stable  Took femara -off of that   dexa 9/16 OP Intol of bisphosphenate and one other medicine Ca and D she takes  Falls- one from a slippery floor - no injuries  fx hx - no fractures in lifetime   Pap 3/17 -it was ok - with gyn/ Dr Helane Rima  Hx of hysterectomy -had cervical dysplasia in the past  Colonoscopy 5/09 with 5 year recall - then just had one 2-3 mo ago (March) with a Dr in [place]  Had b9 polyps  Recall 2 y  64   Hypothyroid - has seen Dr Chalmers Cater - and turning her over to me  No complications  No clinical changes  Does not need tsh today - that was just done   Hyperlipidemia Lab Results  Component Value Date   CHOL 178 06/05/2015   HDL 74.30 06/05/2015   [place] 90 06/05/2015   LDLDIRECT 134.1 09/11/2013   TRIG 71.0 06/05/2015   CHOLHDL 2 06/05/2015   Due for this  Not a lot of fatty foods   Patient Active Problem List   Diagnosis Date Noted  . Routine general medical examination at a health care facility 02/03/2016  . Health care maintenance 10/30/2015  . Palpitations 07/09/2015  . Insomnia 07/09/2015  . Weakness generalized 02/02/2015  . Acne 09/19/2013  . Lipoma 09/19/2013  . Colon cancer screening 09/19/2013  . Encounter for Medicare annual wellness exam 09/10/2013  . Breast cancer of upper-outer quadrant of right female breast ([place]) 08/24/2013  . Osteoporosis 05/18/2013  . Bunion,  left foot 05/09/2013  . Cervical dysplasia   . Hyperlipidemia 07/28/2011  . Fungal nail infection 07/28/2011  . HIATAL HERNIA 03/02/2010  . CONSTIPATION 03/02/2010  . RECTAL BLEEDING 03/02/2010  . HEPATIC CYST 03/02/2010  . COLONIC POLYPS, HYPERPLASTIC, HX OF 03/02/2010  . LEG CRAMPS, NOCTURNAL 01/19/2008  . Hypothyroidism 01/17/2008  . IBS 01/17/2008  . MIGRAINES, HX OF 01/17/2008   Past Medical History  Diagnosis Date  . DUB (dysfunctional uterine bleeding)   . Adenomyosis   . 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 Personal history of malignant neoplasm of breast   . Malignant neoplasm of colon, unspecified site     hyperplastic  . Personal history of colonic polyps   . Other specified disorders of liver     Hepatic cyst  . Diaphragmatic hernia without mention of obstruction or gangrene   . Unspecified hypothyroidism   . Irritable bowel syndrome   . Cramp of limb   . Hx of migraines   . Disorder of bone and cartilage, unspecified   . Colon polyps   . Hypothyroid   . Cervical dysplasia   . Osteoporosis, unspecified 05/18/2013   Past Surgical History  Procedure Laterality Date  . Breast  lumpectomy      right  . Breast enhancement surgery      bilateral  . Breast implant removal      bilateral  . Partial hysterectomy  1980    endometriosis  . Hemorroidectomy  1990's  . Tubal ligation    . Septoplasty    . Appendectomy  1965  . Skin biopsy    . Vaginal hysterectomy    . Gynecologic cryosurgery    . Colposcopy    . Augmentation mammaplasty     Social History  Substance Use Topics  . Smoking status: Never Smoker   . Smokeless tobacco: Never Used  . Alcohol Use: No   Family History  Problem Relation Age of Onset  . Uterine cancer Mother   . Aneurysm Mother 40  . Cancer Mother     uterine CA  . Colon cancer Maternal Aunt   . Breast cancer Maternal Aunt     Age 22's  . Heart disease Father   . Prostate cancer Father   .  Cancer Father     prostate CA  . Colon polyps Brother   . Cancer Cousin     x 2 ? pancreatic  . Cancer Maternal Aunt     Brain tumor   Allergies  Allergen Reactions  . Bisphosphonates Other (See Comments)    Aches and pains   . Codeine     REACTION: hallucinations  . Crestor [Rosuvastatin Calcium] Palpitations    Palpitations and insomnia    Current Outpatient Prescriptions on File Prior to Visit  Medication Sig Dispense Refill  . aspirin 81 MG chewable tablet Chew 81 mg by mouth daily.      Marland Kitchen BIOTIN PO Take 5,000 mcg by mouth at bedtime.     . Calcium Carbonate-Vit D-Min (CALTRATE PLUS PO) Take 1 capsule by mouth daily.     . Cholecalciferol 2000 UNITS TABS Take 1 tablet by mouth daily.    . clonazePAM (KLONOPIN) 1 MG tablet Take 1 tablet (1 mg total) by mouth at bedtime. 30 tablet 0  . fish oil-omega-3 fatty acids 1000 MG capsule Take 1 g by mouth daily.     Marland Kitchen glucosamine-chondroitin 500-400 MG tablet Take 1 tablet by mouth daily.     Marland Kitchen levothyroxine (SYNTHROID, LEVOTHROID) 75 MCG tablet Take 75 mcg by mouth daily.      . Multiple Vitamin (MULTIVITAMIN) tablet Take 1 tablet by mouth daily.      Marland Kitchen tretinoin (RETIN-A) 0.025 % cream Apply topically at bedtime. To apply to face for acne once daily 45 g 3  . vitamin C (ASCORBIC ACID) 500 MG tablet Take 500 mg by mouth daily.     No current facility-administered medications on file prior to visit.       Review of Systems    Review of Systems  Constitutional: Negative for fever, appetite change, fatigue and unexpected weight change.  Eyes: Negative for pain and visual disturbance.  Respiratory: Negative for cough and shortness of breath.   Cardiovascular: Negative for cp or palpitations    Gastrointestinal: Negative for nausea, diarrhea and constipation.  Genitourinary: Negative for urgency and frequency.  Skin: Negative for pallor or rash   MSK pos for occ aches and pains  Neurological: Negative for weakness,  light-headedness, numbness and headaches.  Hematological: Negative for adenopathy. Does not bruise/bleed easily.  Psychiatric/Behavioral: Negative for dysphoric mood. The patient is not nervous/anxious.      Objective:   Physical Exam  Constitutional: She  appears well-developed and well-nourished. No distress.  Well appearing   HENT:  Head: Normocephalic and atraumatic.  Right Ear: External ear normal.  Left Ear: External ear normal.  Mouth/Throat: Oropharynx is clear and moist.  Eyes: Conjunctivae and EOM are normal. Pupils are equal, round, and reactive to light. No scleral icterus.  Neck: Normal range of motion. Neck supple. No JVD present. Carotid bruit is not present. No thyromegaly present.  Cardiovascular: Normal rate, regular rhythm, normal heart sounds and intact distal pulses.  Exam reveals no gallop.   Pulmonary/Chest: Effort normal and breath sounds normal. No respiratory distress. She has no wheezes. She exhibits no tenderness.  Abdominal: Soft. Bowel sounds are normal. She exhibits no distension, no abdominal bruit and no mass. There is no tenderness.  Genitourinary: No breast swelling, tenderness, discharge or bleeding.  Bilateral breast implants Scarring on the R in axilla from prev cancer tx -baseline No focal M noted  Musculoskeletal: Normal range of motion. She exhibits no edema or tenderness.  Petite frame  No kyphosis  Lymphadenopathy:    She has no cervical adenopathy.  Neurological: She is alert. She has normal reflexes. No cranial nerve deficit. She exhibits normal muscle tone. Coordination normal.  Skin: Skin is warm and dry. No rash noted. No erythema. No pallor.  Keratotic area on back of R hand - resembles AK or poss SK= she will f/u for tx of this   Solar lentigines diffusely  Psychiatric: She has a normal mood and affect.  Cheerful and talkative          Assessment & Plan:   Problem List Items Addressed This Visit      Endocrine    Hypothyroidism    Recent visit to Dr Chalmers Cater - stable tsh and dose  We will take over at next annual visit         Musculoskeletal and Integument   Osteoporosis    dexa 9/16 Intol of 2 OP meds in the past  On ca and D No fractures  Prev tx with femara for Bca   Given pt info on prolia to see if that is an option - she can check with her insurance         Other   Routine general medical examination at a health care facility - Primary    Reviewed health habits including diet and exercise and skin cancer prevention Reviewed appropriate screening tests for age  Also reviewed health mt list, fam hx and immunization status , as well as social and family history   See HPI AMW visit reviewed from today  Labs today  Enc good diet and exercise Given info on Prolia to look at and consider for OP        Relevant Orders   CBC with Differential/Platelet   Comprehensive metabolic panel   Lipid panel   Insomnia    Refilled klonopin for prn use       Relevant Medications   clonazePAM (KLONOPIN) 1 MG tablet   Hyperlipidemia    Lab today Disc goals for lipids and reasons to control them Rev labs with pt (from last check) -diet is good Rev low sat fat diet in detail       Breast cancer of upper-outer quadrant of right female breast ([place])    Doing well overall  Had mammogram and MRI this past fall  No longer on femara  No change on exam today Enc self exams       Relevant Medications  clonazePAM (KLONOPIN) 1 MG tablet    Other Visit Diagnoses    Actinic skin damage        Relevant Medications    tretinoin (RETIN-A) 0.025 % cream    Need for hepatitis C screening test

## 2016-02-03 NOTE — Assessment & Plan Note (Signed)
Recent visit to Dr Chalmers Cater - stable tsh and dose  We will take over at next annual visit

## 2016-02-03 NOTE — Progress Notes (Signed)
Pre visit review using our clinic review tool, if applicable. No additional management support is needed unless otherwise documented below in the visit note. 

## 2016-02-03 NOTE — Assessment & Plan Note (Signed)
dexa 9/16 Intol of 2 OP meds in the past  On ca and D No fractures  Prev tx with femara for Bca   Given pt info on prolia to see if that is an option - she can check with her insurance

## 2016-02-03 NOTE — Assessment & Plan Note (Signed)
Doing well overall  Had mammogram and MRI this past fall  No longer on femara  No change on exam today Enc self exams

## 2016-02-03 NOTE — Assessment & Plan Note (Signed)
Reviewed health habits including diet and exercise and skin cancer prevention Reviewed appropriate screening tests for age  Also reviewed health mt list, fam hx and immunization status , as well as social and family history   See HPI AMW visit reviewed from today  Labs today  Enc good diet and exercise Given info on Prolia to look at and consider for OP

## 2016-02-03 NOTE — Assessment & Plan Note (Signed)
Lab today Disc goals for lipids and reasons to control them Rev labs with pt (from last check) -diet is good Rev low sat fat diet in detail

## 2016-02-04 ENCOUNTER — Encounter: Payer: Self-pay | Admitting: *Deleted

## 2016-02-04 LAB — CBC WITH DIFFERENTIAL/PLATELET
BASOS ABS: 0 10*3/uL (ref 0.0–0.1)
BASOS PCT: 0.4 % (ref 0.0–3.0)
EOS ABS: 0.1 10*3/uL (ref 0.0–0.7)
Eosinophils Relative: 1.7 % (ref 0.0–5.0)
HEMATOCRIT: 39.4 % (ref 36.0–46.0)
HEMOGLOBIN: 13.5 g/dL (ref 12.0–15.0)
Lymphocytes Relative: 28.2 % (ref 12.0–46.0)
Lymphs Abs: 1 10*3/uL (ref 0.7–4.0)
MCHC: 34.3 g/dL (ref 30.0–36.0)
MCV: 92.4 fl (ref 78.0–100.0)
MONOS PCT: 8.8 % (ref 3.0–12.0)
Monocytes Absolute: 0.3 10*3/uL (ref 0.1–1.0)
Neutro Abs: 2.2 10*3/uL (ref 1.4–7.7)
Neutrophils Relative %: 60.9 % (ref 43.0–77.0)
Platelets: 273 10*3/uL (ref 150.0–400.0)
RBC: 4.26 Mil/uL (ref 3.87–5.11)
RDW: 13.6 % (ref 11.5–15.5)
WBC: 3.7 10*3/uL — AB (ref 4.0–10.5)

## 2016-02-04 LAB — COMPREHENSIVE METABOLIC PANEL
ALBUMIN: 4.8 g/dL (ref 3.5–5.2)
ALT: 15 U/L (ref 0–35)
AST: 22 U/L (ref 0–37)
Alkaline Phosphatase: 50 U/L (ref 39–117)
BILIRUBIN TOTAL: 0.5 mg/dL (ref 0.2–1.2)
BUN: 13 mg/dL (ref 6–23)
CALCIUM: 10.3 mg/dL (ref 8.4–10.5)
CO2: 31 mEq/L (ref 19–32)
CREATININE: 0.66 mg/dL (ref 0.40–1.20)
Chloride: 105 mEq/L (ref 96–112)
GFR: 94.12 mL/min (ref >60.00)
Glucose, Bld: 84 mg/dL (ref 70–99)
Potassium: 4.9 mEq/L (ref 3.5–5.1)
Sodium: 141 mEq/L (ref 135–145)
Total Protein: 6.9 g/dL (ref 6.0–8.3)

## 2016-02-04 LAB — HEPATITIS C ANTIBODY: HCV Ab: NEGATIVE

## 2016-02-04 LAB — LIPID PANEL
Cholesterol: 224 mg/dL — ABNORMAL HIGH (ref 0–200)
HDL: 59.4 mg/dL (ref >39.00)
LDL Cholesterol: 146 mg/dL — ABNORMAL HIGH (ref 0–99)
NonHDL: 164.11
TRIGLYCERIDES: 91 mg/dL (ref 0.0–149.0)
Total CHOL/HDL Ratio: 4
VLDL: 18.2 mg/dL (ref 0.0–40.0)

## 2016-02-05 ENCOUNTER — Encounter: Payer: Self-pay | Admitting: *Deleted

## 2016-04-01 ENCOUNTER — Telehealth: Payer: Self-pay

## 2016-04-01 NOTE — Telephone Encounter (Signed)
Amy with [facility] in [place] request ok to fill Klonopin 1 mg rx printed 02/03/16 for # 30 x 0 refill because it is controlled and [place] of [place]. OKed 02/03/16 rx per med list.

## 2016-04-07 ENCOUNTER — Telehealth: Payer: Self-pay | Admitting: *Deleted

## 2016-04-07 NOTE — Telephone Encounter (Signed)
PA approved for Retin-A cream at [email or web address], they will fax over confirmation letter, pharmacy notified

## 2016-04-15 ENCOUNTER — Telehealth: Payer: Self-pay

## 2016-04-15 NOTE — Telephone Encounter (Signed)
Left voicemail letting pt know Dr. Marliss Coots comments and instructions. (i'm not in the office today), I advise pt to call our office directly and schedule a f/u appt for the hand pain

## 2016-04-15 NOTE — Telephone Encounter (Signed)
Osteoporosis does not cause pain or swelling (it just increases your risk of fracture).  So perhaps she has some arthritis or something else going on.  Follow up when I get back and we can address it.

## 2016-04-15 NOTE — Telephone Encounter (Signed)
Pt prefers to send note to Dr Glori Bickers, pt aware Dr Glori Bickers out of office; pt has swelling and pain in knuckles at thumb; pt thinks may be osteoporosis. Pt said has taken 2 osteoporosis meds (pt could not remember names of meds) that she did not tolerate well. Pt wants to try 2nd osteoporosis med again to see if will help with thumb pain. Pt seen 07/09/15 for osteoporosis and annual exam on 02/03/16. [facility] [place]. Pt request cb.

## 2016-06-21 ENCOUNTER — Telehealth: Payer: Self-pay

## 2016-06-21 MED ORDER — MUPIROCIN 2 % EX OINT: 1.0000 "application " | TOPICAL_OINTMENT | Freq: Two times a day (BID) | CUTANEOUS | 0 refills | Status: AC

## 2016-06-21 NOTE — Telephone Encounter (Signed)
Pt notified Rx sent to pharmacy and advise to f/u if sxs don't resolve or worsen, pt verbalized understanding

## 2016-06-21 NOTE — Telephone Encounter (Signed)
F/u if it does not resolve in a day or two- a skin infection on the face can be very dangerous (or if fever-etc) Sent mucopuricin

## 2016-06-21 NOTE — Telephone Encounter (Signed)
Pt would like a prescription for a mosquito bite infection on her face, she said one of her family members uses mupirocin. She said she has tried everything OTC.

## 2016-07-19 ENCOUNTER — Encounter: Payer: Self-pay | Admitting: Family Medicine

## 2016-08-30 ENCOUNTER — Other Ambulatory Visit: Payer: Self-pay | Admitting: Obstetrics and Gynecology

## 2016-08-30 DIAGNOSIS — Z853 Personal history of malignant neoplasm of breast: Secondary | ICD-10-CM

## 2016-09-09 ENCOUNTER — Other Ambulatory Visit: Payer: Self-pay | Admitting: Family Medicine

## 2016-09-09 DIAGNOSIS — G4700 Insomnia, unspecified: Secondary | ICD-10-CM

## 2016-09-09 NOTE — Telephone Encounter (Signed)
Pt has CPE scheduled on 02/15/17, last filled on 02/03/16 #30 tabs with 0 refills, please advise

## 2016-09-09 NOTE — Telephone Encounter (Signed)
Px written for call in   

## 2016-09-09 NOTE — Telephone Encounter (Signed)
Rx called in as prescribed 

## 2016-11-11 ENCOUNTER — Other Ambulatory Visit: Payer: Self-pay | Admitting: Obstetrics and Gynecology

## 2016-11-11 DIAGNOSIS — N6310 Unspecified lump in the right breast, unspecified quadrant: Secondary | ICD-10-CM

## 2016-11-16 ENCOUNTER — Other Ambulatory Visit: Payer: Medicare Other

## 2016-11-16 ENCOUNTER — Encounter: Payer: Self-pay | Admitting: Family Medicine

## 2016-12-06 ENCOUNTER — Telehealth: Payer: Self-pay

## 2016-12-06 NOTE — Telephone Encounter (Signed)
I never received a PA form but I just called insurance and did PA over the phone it was approved from today until 09/12/17 they will fax over a confirmation letter, I called and notified pharmacy PA was approved

## 2016-12-06 NOTE — Telephone Encounter (Signed)
[place] pharmacist with [facility] [place] left v/m requesting cb about update on prior auth for retin A. Sandy faxed info on 11/29/16. Prior auth # to call is [id number]. ID #[id number].

## 2017-01-07 ENCOUNTER — Telehealth: Payer: Self-pay | Admitting: Oncology

## 2017-01-07 ENCOUNTER — Ambulatory Visit
Admission: RE | Admit: 2017-01-07 | Discharge: 2017-01-07 | Disposition: A | Discharge status: Home / Self Care | Payer: Medicare Other | Source: Ambulatory Visit | Attending: Obstetrics and Gynecology | Admitting: Obstetrics and Gynecology

## 2017-01-07 DIAGNOSIS — Z853 Personal history of malignant neoplasm of breast: Secondary | ICD-10-CM

## 2017-01-07 MED ORDER — GADOBENATE DIMEGLUMINE 529 MG/ML IV SOLN
11.0000 mL | Freq: Once | INTRAVENOUS | Status: AC | PRN
  Administered 2017-01-07: 11 mL via INTRAVENOUS

## 2017-01-07 NOTE — Telephone Encounter (Signed)
Per pt ROI form to mail records to [facility]

## 2017-01-25 IMAGING — US US SOFT TISSUE HEAD/NECK
1 series · 14 of 25 positions shown · non-contrast
Comparison: 12/13/2012

CLINICAL DATA: Follow-up thyroid nodules

EXAM:
THYROID ULTRASOUND
TECHNIQUE: Ultrasound examination of the thyroid gland and adjacent soft
tissues was performed.

[Series 1: us soft tissue head/neck · 0.05mm/px · 14 of 41 slices shown]
[im 1/41]
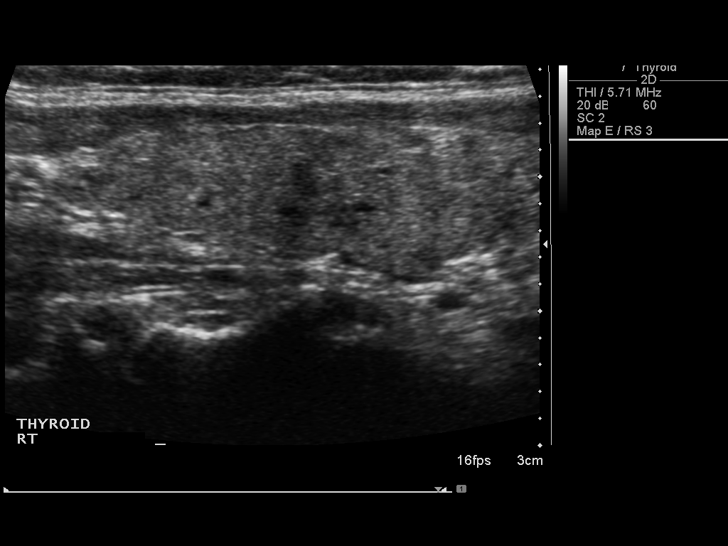
[im 4/41]
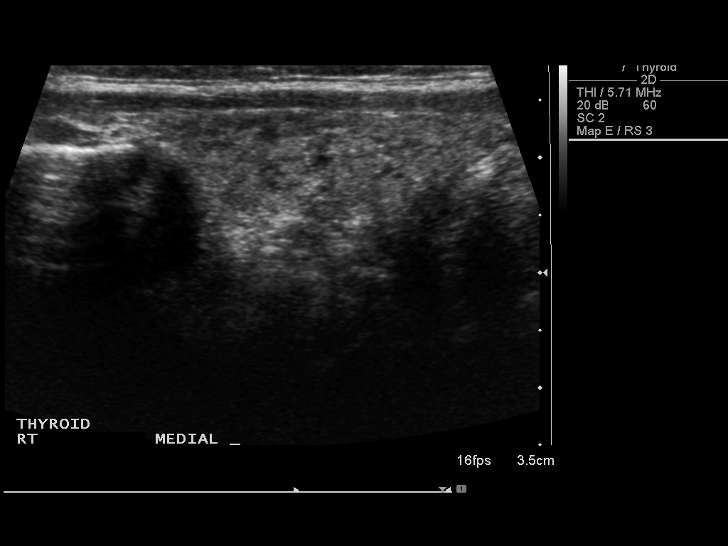
[im 7/41]
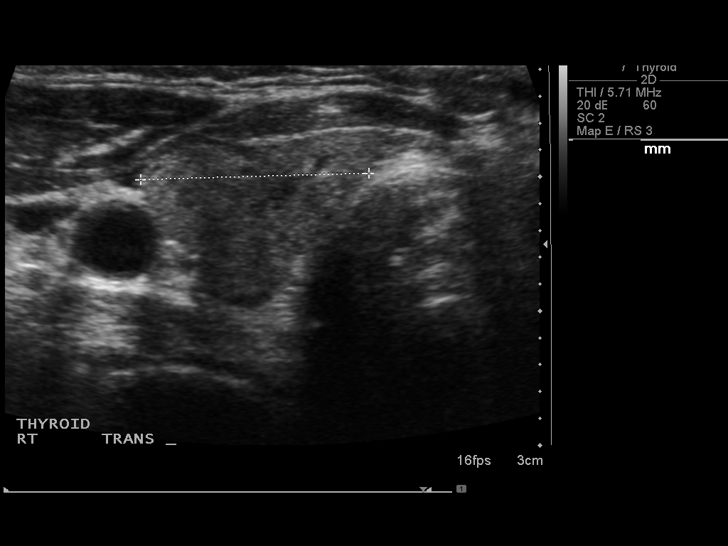
[im 11/41]
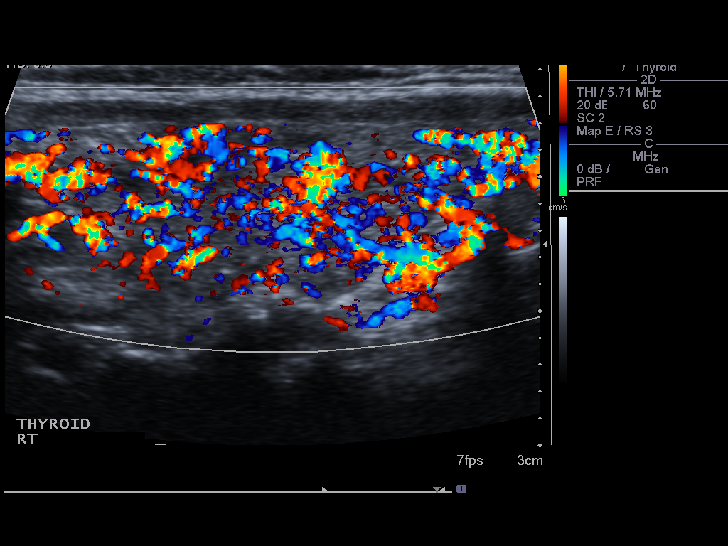
[im 14/41]
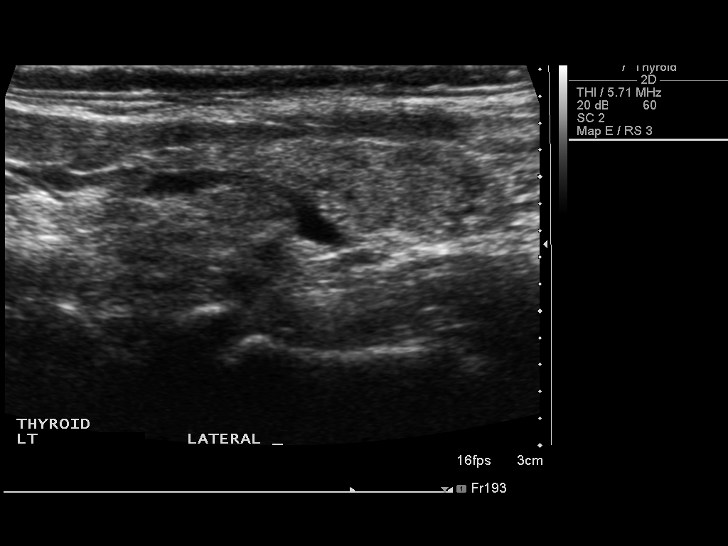
[im 16/41]
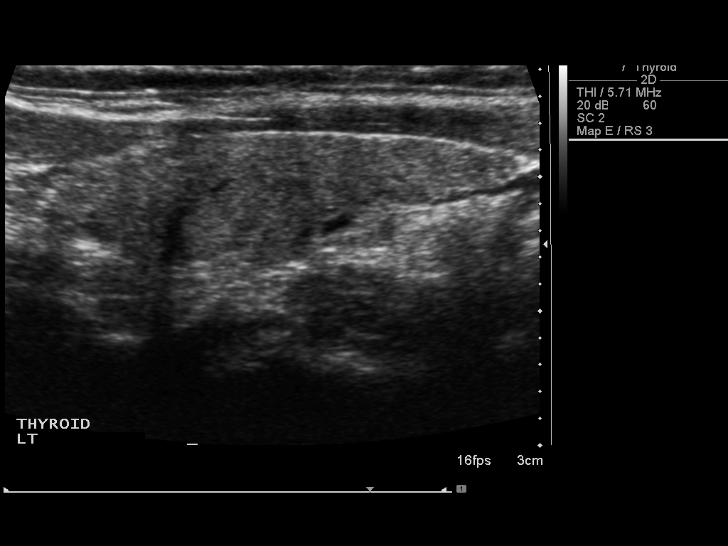
[im 19/41]
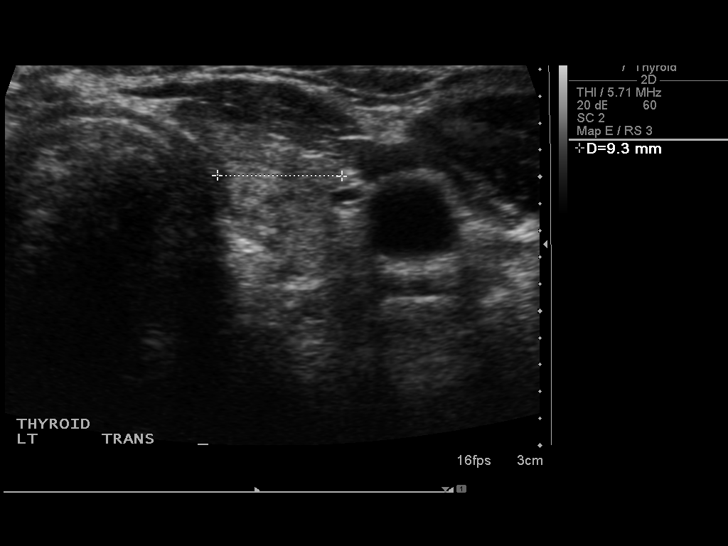
[im 22/41]
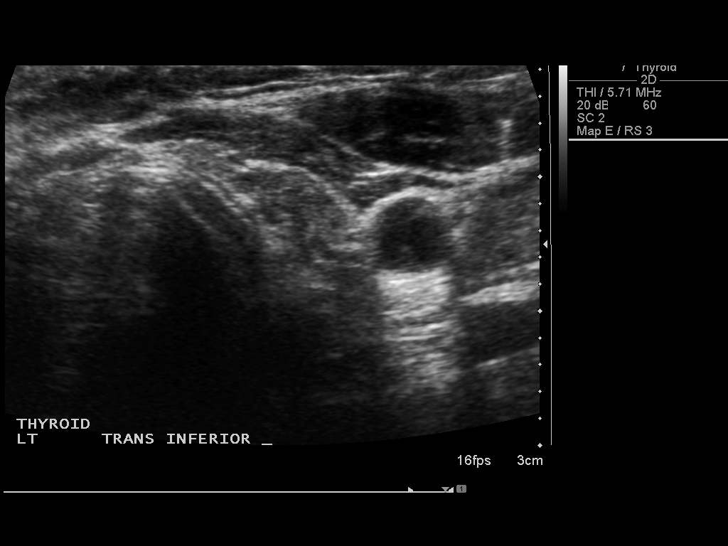
[im 26/41]
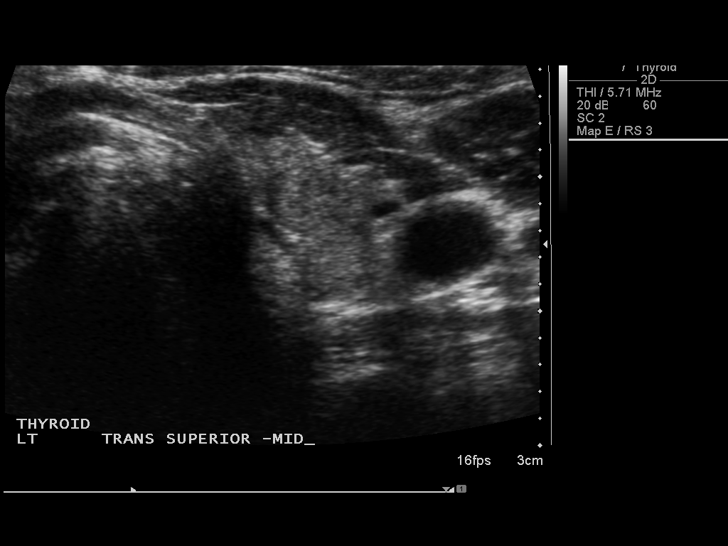
[im 27/41]
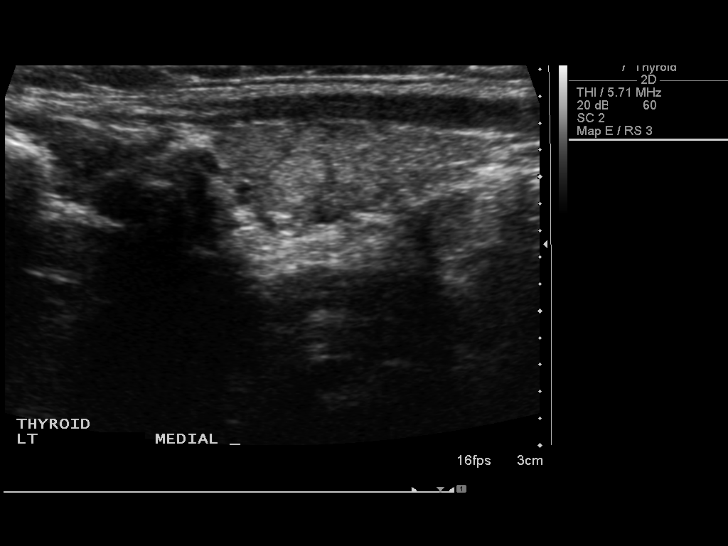
[im 31/41]
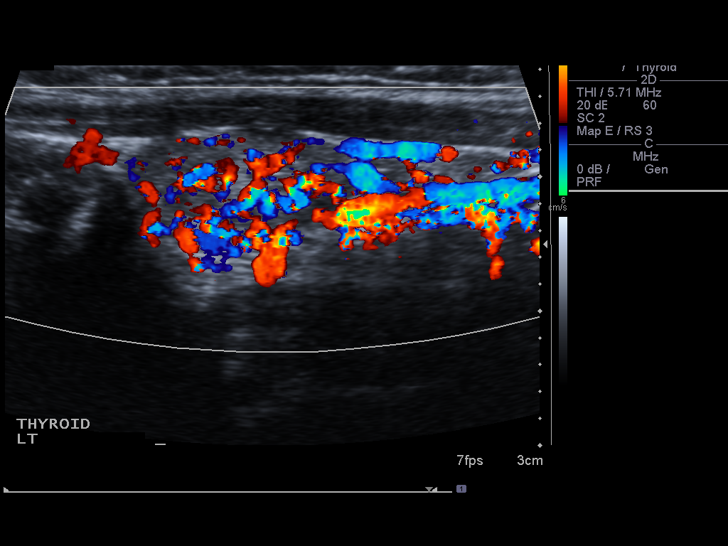
[im 34/41]
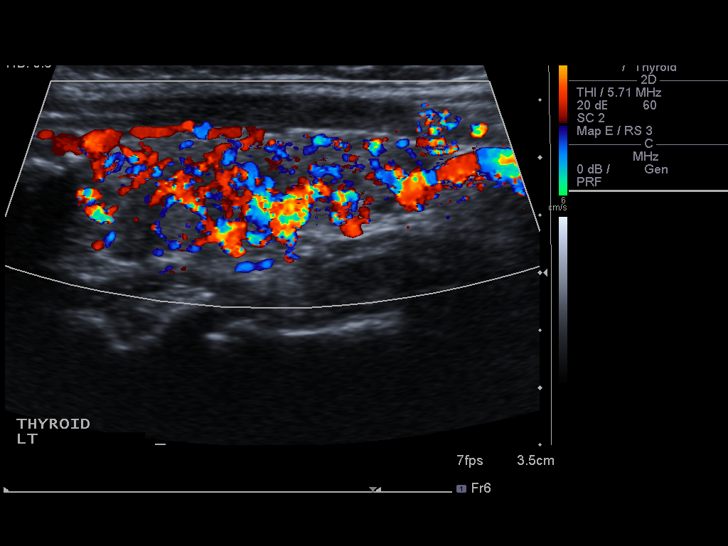
[im 37/41]
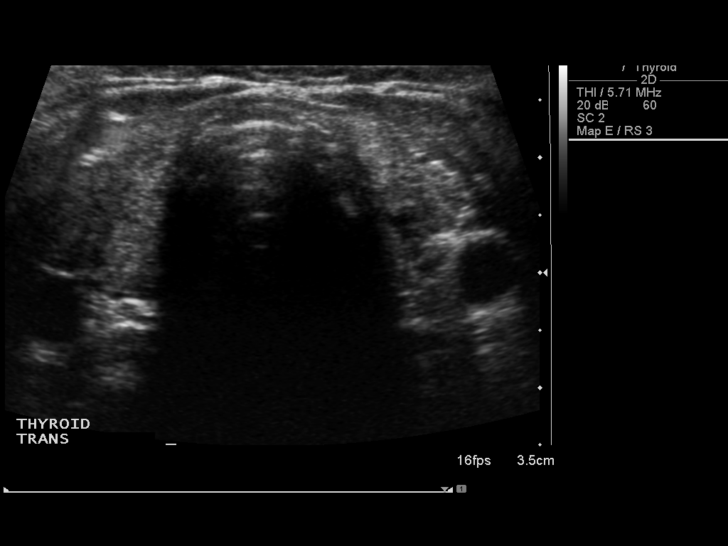
[im 41/41]
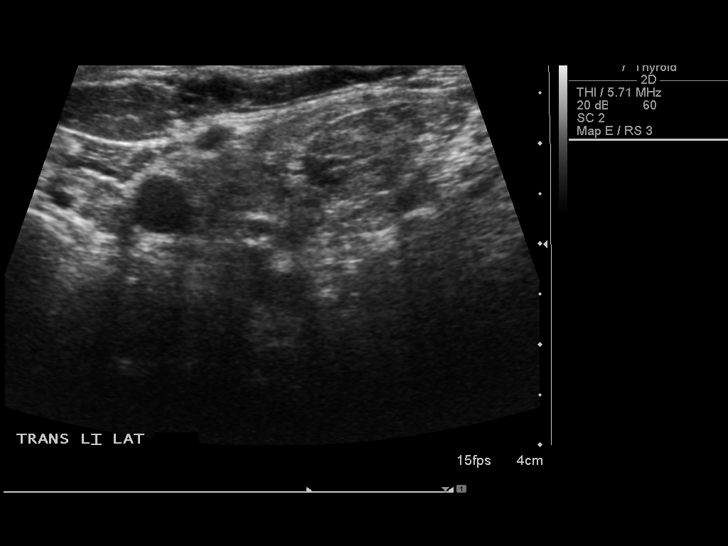

[14 of 25 positions shown; findings below may reference images not displayed]

FINDINGS: Right thyroid lobe

Measurements: 4.5 x 1.3 x 1.7 cm. Heterogeneous tissue without focal
nodule

Left thyroid lobe

Measurements: 3.6 x 1.0 x 0.9 cm. Heterogeneous gland. 5 mm upper
pole posterior slightly hyperechoic nodule is stable. Adjacent mid 5
mm similar-appearing nodule is also stable.

Isthmus

Thickness: 1 mm.  No nodules visualized.

Lymphadenopathy

None visualized.
IMPRESSION: Small 5 mm nodules in the left lobe are stable.

## 2017-02-15 ENCOUNTER — Ambulatory Visit: Payer: Medicare Other

## 2017-02-15 ENCOUNTER — Encounter: Payer: Medicare Other | Admitting: Family Medicine

## 2017-11-07 NOTE — Progress Notes (Signed)
This encounter was created in error - please disregard.
